# Patient Record
Sex: Male | Born: 1935
Health system: Southern US, Community
[De-identification: ages and names within clinical notes are randomized; demographics above are authoritative.]

## PROBLEM LIST (undated history)

## (undated) DIAGNOSIS — E785 Hyperlipidemia, unspecified: Secondary | ICD-10-CM

## (undated) DIAGNOSIS — Z87442 Personal history of urinary calculi: Secondary | ICD-10-CM

## (undated) DIAGNOSIS — I499 Cardiac arrhythmia, unspecified: Secondary | ICD-10-CM

## (undated) DIAGNOSIS — H269 Unspecified cataract: Secondary | ICD-10-CM

## (undated) DIAGNOSIS — N200 Calculus of kidney: Secondary | ICD-10-CM

## (undated) DIAGNOSIS — M1990 Unspecified osteoarthritis, unspecified site: Secondary | ICD-10-CM

## (undated) DIAGNOSIS — I10 Essential (primary) hypertension: Secondary | ICD-10-CM

## (undated) DIAGNOSIS — I2510 Atherosclerotic heart disease of native coronary artery without angina pectoris: Secondary | ICD-10-CM

## (undated) DIAGNOSIS — R002 Palpitations: Secondary | ICD-10-CM

## (undated) HISTORY — DX: Calculus of kidney: N20.0

## (undated) HISTORY — PX: ANGIOPLASTY: SHX39

## (undated) HISTORY — PX: SPINE SURGERY: SHX786

## (undated) HISTORY — PX: BACK SURGERY: SHX140

## (undated) HISTORY — DX: Unspecified cataract: H26.9

## (undated) HISTORY — DX: Hyperlipidemia, unspecified: E78.5

## (undated) HISTORY — DX: Palpitations: R00.2

## (undated) HISTORY — PX: INGUINAL HERNIA REPAIR: SUR1180

## (undated) HISTORY — PX: LAMINECTOMY: SHX219

---

## 1994-07-31 HISTORY — PX: CORONARY ANGIOPLASTY WITH STENT PLACEMENT: SHX49

## 1998-01-04 ENCOUNTER — Ambulatory Visit (HOSPITAL_COMMUNITY): Admission: RE | Admit: 1998-01-04 | Discharge: 1998-01-04 | Payer: Self-pay | Admitting: *Deleted

## 1998-01-08 ENCOUNTER — Inpatient Hospital Stay (HOSPITAL_COMMUNITY): Admission: RE | Admit: 1998-01-08 | Discharge: 1998-01-09 | Payer: Self-pay | Admitting: *Deleted

## 1999-11-09 ENCOUNTER — Emergency Department (HOSPITAL_COMMUNITY): Admission: EM | Admit: 1999-11-09 | Discharge: 1999-11-09 | Payer: Self-pay | Admitting: Emergency Medicine

## 1999-11-09 ENCOUNTER — Encounter: Payer: Self-pay | Admitting: Emergency Medicine

## 2010-04-08 ENCOUNTER — Encounter: Payer: Self-pay | Admitting: Cardiology

## 2010-04-08 ENCOUNTER — Ambulatory Visit: Payer: Self-pay | Admitting: Cardiology

## 2010-04-08 ENCOUNTER — Ambulatory Visit: Payer: Self-pay

## 2010-04-08 ENCOUNTER — Ambulatory Visit (HOSPITAL_COMMUNITY): Admission: RE | Admit: 2010-04-08 | Discharge: 2010-04-08 | Payer: Self-pay | Admitting: Cardiology

## 2010-04-08 DIAGNOSIS — I2510 Atherosclerotic heart disease of native coronary artery without angina pectoris: Secondary | ICD-10-CM | POA: Insufficient documentation

## 2010-04-08 DIAGNOSIS — R002 Palpitations: Secondary | ICD-10-CM | POA: Insufficient documentation

## 2010-04-08 DIAGNOSIS — I4949 Other premature depolarization: Secondary | ICD-10-CM | POA: Insufficient documentation

## 2010-04-08 DIAGNOSIS — H8109 Meniere's disease, unspecified ear: Secondary | ICD-10-CM | POA: Insufficient documentation

## 2010-04-12 ENCOUNTER — Inpatient Hospital Stay (HOSPITAL_BASED_OUTPATIENT_CLINIC_OR_DEPARTMENT_OTHER): Admission: RE | Admit: 2010-04-12 | Discharge: 2010-04-12 | Payer: Self-pay | Admitting: Cardiovascular Disease

## 2010-04-12 ENCOUNTER — Ambulatory Visit: Payer: Self-pay | Admitting: Internal Medicine

## 2010-04-13 ENCOUNTER — Telehealth: Payer: Self-pay | Admitting: Cardiology

## 2010-04-14 ENCOUNTER — Ambulatory Visit: Payer: Self-pay | Admitting: Cardiovascular Disease

## 2010-04-14 ENCOUNTER — Ambulatory Visit (HOSPITAL_COMMUNITY): Admission: RE | Admit: 2010-04-14 | Discharge: 2010-04-15 | Payer: Self-pay | Admitting: Cardiovascular Disease

## 2010-04-14 ENCOUNTER — Encounter (INDEPENDENT_AMBULATORY_CARE_PROVIDER_SITE_OTHER): Payer: Self-pay | Admitting: *Deleted

## 2010-04-22 DIAGNOSIS — E785 Hyperlipidemia, unspecified: Secondary | ICD-10-CM | POA: Insufficient documentation

## 2010-04-22 DIAGNOSIS — Z87891 Personal history of nicotine dependence: Secondary | ICD-10-CM | POA: Insufficient documentation

## 2010-04-26 ENCOUNTER — Ambulatory Visit: Payer: Self-pay | Admitting: Cardiology

## 2010-05-04 ENCOUNTER — Encounter: Payer: Self-pay | Admitting: Cardiology

## 2010-07-31 HISTORY — PX: CARDIAC CATHETERIZATION: SHX172

## 2010-08-28 LAB — CONVERTED CEMR LAB
BUN: 22 mg/dL (ref 6–23)
Basophils Absolute: 0 10*3/uL (ref 0.0–0.1)
Basophils Relative: 0.7 % (ref 0.0–3.0)
CO2: 26 meq/L (ref 19–32)
Calcium: 8.8 mg/dL (ref 8.4–10.5)
Chloride: 107 meq/L (ref 96–112)
Creatinine, Ser: 1.2 mg/dL (ref 0.4–1.5)
Eosinophils Absolute: 0.1 10*3/uL (ref 0.0–0.7)
Eosinophils Relative: 0.7 % (ref 0.0–5.0)
GFR calc non Af Amer: 61.39 mL/min (ref >60)
Glucose, Bld: 96 mg/dL (ref 70–99)
HCT: 41.5 % (ref 39.0–52.0)
Hemoglobin: 14.6 g/dL (ref 13.0–17.0)
INR: 0.9 (ref 0.8–1.0)
Lymphocytes Relative: 21.8 % (ref 12.0–46.0)
Lymphs Abs: 1.6 10*3/uL (ref 0.7–4.0)
MCHC: 35 g/dL (ref 30.0–36.0)
MCV: 93.7 fL (ref 78.0–100.0)
Monocytes Absolute: 0.5 10*3/uL (ref 0.1–1.0)
Monocytes Relative: 7.4 % (ref 3.0–12.0)
Neutro Abs: 5.1 10*3/uL (ref 1.4–7.7)
Neutrophils Relative %: 69.4 % (ref 43.0–77.0)
Platelets: 182 10*3/uL (ref 150.0–400.0)
Potassium: 4.3 meq/L (ref 3.5–5.1)
Prothrombin Time: 9.9 s (ref 9.7–11.8)
RBC: 4.43 M/uL (ref 4.22–5.81)
RDW: 13.8 % (ref 11.5–14.6)
Sodium: 142 meq/L (ref 135–145)
WBC: 7.4 10*3/uL (ref 4.5–10.5)
aPTT: 25.6 s (ref 21.7–28.8)

## 2010-08-30 NOTE — Letter (Signed)
[facility]: [facility]   Imported By: Marylou Mccoy 05/20/2010 14:45:40  _____________________________________________________________________  External Attachment:    Type:   Image     Comment:   External Document

## 2010-08-30 NOTE — Miscellaneous (Signed)
Summary: update med  Clinical Lists Changes  Medications: Added new medication of PLAVIX 75 MG TABS (CLOPIDOGREL BISULFATE) Take one tablet by mouth daily

## 2010-08-30 NOTE — Letter (Signed)
Summary: Cardiac Catheterization Instructions- [facility]  [facility], Main Office  [address]   [place], [place] [address]   Phone: [phone]  Fax: [phone]     04/08/2010 MRN: [id number]  Ronald Case [address] [place], [place]  [address]  Dear Dr. Greta Doom,   You are scheduled for a Cardiac Catheterization on Tuesday September 13,2011 with Dr. Excell Seltzer  Please arrive to the 1st floor of the [facility] at [facility] at 10:30 am on the day of your procedure. Please do not arrive before 6:30 a.m. Call the [facility] at [phone] if you are unable to make your appointmnet. The Code to get into the parking garage under the building is 0020. Take the elevators to the 1st floor. You must have someone to drive you home. Someone must be with you for the first 24 hours after you arrive home. Please wear clothes that are easy to get on and off and wear slip-on shoes. Do not eat or drink after midnight except water with your medications that morning. Bring all your medications and current insurance cards with you.  __x_ Make sure you take your aspirin.  _x__ You may take ALL of your medications with water that morning.   The usual length of stay after your procedure is 2 to 3 hours. This can vary.  If you have any questions, please call the office at the number listed above.   Dr. Valera Castle Lisabeth Devoid RN

## 2010-08-30 NOTE — Letter (Signed)
Summary: [facility]  [facility]   Imported By: Marylou Mccoy 05/09/2010 07:30:00  _____________________________________________________________________  External Attachment:    Type:   Image     Comment:   External Document

## 2010-08-30 NOTE — Progress Notes (Signed)
Summary: tal to nurse  Phone Note Call from Patient Call back at Home Phone [phone]   Caller: Patient Summary of Call: pt has a question concerning his medications for his angioplasty tomorrow. [phone] Initial call taken by: Edman Circle,  April 13, 2010 11:27 AM  Follow-up for Phone Call        returned pt called and addressed question whether to take ASA in am, advised to take per pre cath letter.  Claris Gladden, RN

## 2010-08-30 NOTE — Assessment & Plan Note (Signed)
Summary: np6/ palps/ pt has uhc. gd   Visit Type:  new pt visit Referring Provider:  Valera Castle Primary Provider:  Marga Melnick  CC:  palpitations....denies any cp or sob...edema/lower leg/ankles at times when on his feet all day.  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 He presented with similar symptoms at that time and was told he had a vasovagal event. He subsequently was referred for stress test which I do not have access to today but he went into V. tach. This was done with Dr. Graciela Husbands. Cardiac catheterization showed a high-grade right coronary lesion that was stented by Dr. Juanda Chance.   He has not had any objective assessment of his coronary disease since.  He denies exertional angina or dyspnea on exertion. He plays golf and enjoys walking.  Irregularity is happened a couple times over the past week or so. At one point he noticed a radial pulse it was about 40 beats a minute. He did not have any presyncope or syncope. No other associated symptoms. His heart rate was slow enough that he held his beta blocker which she's been on for a long time the next day.  Preventive Screening-Counseling & Management  Alcohol-Tobacco     Smoking Status: quit  Caffeine-Diet-Exercise     Does Patient Exercise: yes      Drug Use:  no.    Current Medications (verified): 1)  Metoprolol Succinate 25 Mg Xr24h-Tab (Metoprolol Succinate) .... 1/2 Tab Once Daily 2)  Lipitor 10 Mg Tabs (Atorvastatin Calcium) .Marland Kitchen.. 1 Tab At Bedtime 3)  Aspirin Ec 325 Mg Tbec (Aspirin) .... Take One Tablet By Mouth Daily  Allergies (verified): No Known Drug Allergies  Past History:  Family History: Last updated: 05-02-2010 Mother: deceased @ 37 Subdural hematoma Father: deceased @ 26 CVA ? Siblings: Brother 53  Social History: Last updated:  2010/05/02 Full Time Physcian GYN Married  Tobacco Use - Former.  quit 1967 Regular Exercise - yes.Kerin Salen, walking 3-4 weekly Drug Use - no  Risk Factors: Exercise: yes (02-May-2010)  Risk Factors: Smoking Status: quit (05-02-2010)  Past Medical History: Kindney stones MENIERE'S DISEASE (ICD-386.00) PALPITATIONS (ICD-785.1)  Past Surgical History: Inguinal hernia repair Lumbar laminectomy x2  Angioplasty 3 stents w/Dr Charlies Constable  Family History: Mother: deceased @ 76 Subdural hematoma Father: deceased @ 22 CVA ? Siblings: Brother 87  Social History: Full Time Physcian GYN Married  Tobacco Use - Former.  quit 1967 Regular Exercise - yes.Kerin Salen, walking 3-4 weekly Drug Use - no Smoking Status:  quit Does Patient Exercise:  yes Drug Use:  no  Review of Systems       negative other than history of present illness  Vital Signs:  Patient profile:   77 year old male Height:      72 inches Weight:      193.8 pounds BMI:     26.38 Pulse rate:   73 / minute Pulse rhythm:   irregular BP sitting:   120 / 80  (left arm) Cuff size:   large  Vitals Entered By: Danielle Rankin, CMA 05/02/2010 1:45 PM)  Physical Exam  General:  no acute distress, well-developed well-nourished. Head:  normocephalic and atraumatic Eyes:  glasses otherwise normal Neck:  Neck supple, no JVD. No masses, thyromegaly or abnormal cervical nodes. Chest Janelys Glassner:  no deformities or breast masses noted  Lungs:  Clear bilaterally to auscultation and percussion. Heart:  PMI not displaced, normal S1-S2, no murmur or gallop. Carotids equal bilaterally without bruits Abdomen:  Bowel sounds positive; abdomen soft and non-tender without masses, organomegaly, or hernias noted. No hepatosplenomegaly. Msk:  Back normal, normal gait. Muscle strength and tone normal. Pulses:  pulses normal in all 4 extremities Extremities:  No clubbing or cyanosis. Neurologic:  Alert and oriented x 3. Skin:  Intact  without lesions or rashes. Psych:  Normal affect.   Problems:  Medical Problems Added: 1)  Dx of Premature Ventricular Contractions  (ICD-427.69) 2)  Dx of Coronary Atherosclerosis Native Coronary Artery  (ICD-414.01) 3)  Dx of Meniere's Disease  (ICD-386.00) 4)  Dx of Palpitations  (ICD-785.1)  EKG  Procedure date:  04/08/2010  Findings:      normal sin one PVC, otherwise normal EKG.[place] rhythm with a first-degree AV block and  Impression & Recommendations:  Problem # 1:  PALPITATIONS (ICD-785.1) I suspect these are PVCs. The question is this related to any underlying ischemia. Coupled with her bradycardia, which he had in the past, I am concerned about recurrent coronary ischemia. I have recommended cardiac catheterization next Tuesday with Dr. Excell Seltzer. Indication this patient benefits were discussed. I have told him to take it easy over the weekend starting to walk and plate. I made no change in his medical program. I have given him my cell phone number in case he has any problems over the interim. I've also discussed it with Dr. Alwyn Ren his primary care physician. His wife is aware of his visit today but is not here.  I've asked him to proceed with an outpatient cardiac catheterization next week. Dr. Juanda Chance  His updated medication list for this problem includes:    Metoprolol Succinate 25 Mg Xr24h-tab (Metoprolol succinate) .Marland Kitchen... 1/2 tab once daily    Aspirin Ec 325 Mg Tbec (Aspirin) .Marland Kitchen... Take one tablet by mouth daily  Problem # 2:  PREMATURE VENTRICULAR CONTRACTIONS (ICD-427.69) Assessment: New  His updated medication list for this problem includes:    Metoprolol Succinate 25 Mg Xr24h-tab (Metoprolol succinate) .Marland Kitchen... 1/2 tab once daily    Aspirin Ec 325 Mg Tbec (Aspirin) .Marland Kitchen... Take one tablet by mouth daily  Orders: EKG w/ Interpretation (93000) TLB-BMP (Basic Metabolic Panel-BMET) (80048-METABOL) TLB-CBC Platelet - w/Differential (85025-CBCD) TLB-PT (Protime)  (85610-PTP) TLB-PTT (85730-PTTL) T-2 View CXR (71020TC)  His updated medication list for this problem includes:    Metoprolol Succinate 25 Mg Xr24h-tab (Metoprolol succinate) .Marland Kitchen... 1/2 tab once daily    Aspirin Ec 325 Mg Tbec (Aspirin) .Marland Kitchen... Take one tablet by mouth daily  Problem # 3:  CORONARY ATHEROSCLEROSIS NATIVE CORONARY ARTERY (ICD-414.01)  His updated medication list for this problem includes:    Metoprolol Succinate 25 Mg Xr24h-tab (Metoprolol succinate) .Marland Kitchen... 1/2 tab once daily    Aspirin Ec 325 Mg Tbec (Aspirin) .Marland Kitchen... Take one tablet by mouth daily  Orders: EKG w/ Interpretation (93000) TLB-BMP (Basic Metabolic Panel-BMET) (80048-METABOL) TLB-CBC Platelet - w/Differential (85025-CBCD) TLB-PT (Protime) (85610-PTP) TLB-PTT (85730-PTTL) T-2 View CXR (71020TC)  His updated medication list for this problem includes:    Metoprolol Succinate 25 Mg Xr24h-tab (Metoprolol succinate) .Marland Kitchen... 1/2 tab once daily    Aspirin Ec 325 Mg Tbec (Aspirin) .Marland Kitchen... Take one tablet by mouth daily  Patient Instructions: 1)  Your physician has requested that you have a cardiac catheterization.  Cardiac catheterization is used to diagnose and/or treat various heart conditions. Doctors may recommend this procedure for a number  of different reasons. The most common reason is to evaluate chest pain. Chest pain can be a symptom of coronary artery disease (CAD), and cardiac catheterization can show whether plaque is narrowing or blocking your heart's arteries. This procedure is also used to evaluate the valves, as well as measure the blood flow and oxygen levels in different parts of your heart.  For further information please visit [email or web address]  Please follow instruction sheet, as given. 2)  Your physician recommends that you continue on your current medications as directed. Please refer to the Current Medication list given to you today.

## 2010-08-30 NOTE — Cardiovascular Report (Signed)
Summary: Pre Cath Orders   Pre Cath Orders   Imported By: Roderic Ovens 04/18/2010 13:37:45  _____________________________________________________________________  External Attachment:    Type:   Image     Comment:   External Document

## 2010-08-30 NOTE — Assessment & Plan Note (Signed)
Summary: eph. appt is 1:30/ gd   Visit Type:  EPH Referring Provider:  Valera Castle Primary Provider:  Marga Melnick  CC:  PVC's still.....denies any other complaint today.  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 He had a 90% in-stent restenosis of the right coronary artery. He had PTCA and a drug-eluting stent placed by Dr. Excell Seltzer. He had nonobstructive disease otherwise. He has normal left ventricular systolic function by catheter and 2-D echocardiogram.  Since discharge his PVCs have been a little bit better.    Clinical Reports Reviewed:  CXR:  04/08/2010: CXR Results:   Clinical Data: Coronary artery disease.    CHEST - 2 VIEW    Comparison: None.    Findings: The cardiac silhouette is normal size and shape. The   lungs are well aerated and free of infiltrates. No pleural   abnormality is evident. Large osteophytes are seen in the spine   consistent with diffuse idiopathic skeletal hyperostosis.  There is   mild degenerative spondylosis.    IMPRESSION:   No acute or active cardiopulmonary process is identified.    Read By:  Crawford Givens,  M.D.   Released By:  Crawford Givens,  M.D.  Cardiac Cath:  04/14/2010: Cardiac Cath Findings:   FINAL CONCLUSIONS:  Successful percutaneous intervention of the right   coronary artery using a single drug-eluting stent reducing to 90% in-   stent restenosis to 0 with TIMI 3 flow both pre and post.      RECOMMENDATIONS:  The patient should remain on dual antiplatelet therapy   with aspirin and Plavix for a minimum of 12 months.               Veverly Fells. Excell Seltzer, MD         04/12/2010: Cardiac Cath Findings:   Left ventriculography:  LV function is normal.  The LVEF is estimated at   60%.      ASSESSMENT:   1. Severe in-stent restenosis of the mid-right coronary artery.   2. Minor nonobstructive stenosis of the  left main, left anterior       descending (coronary artery), and left circumflex.   3. Normal left ventricular function.      PLAN:  Recommend to start the patient on clopidogrel.  We will plan on   PCI of the right coronary artery using a drug-eluting stent platform.   Procedure will be performed later this week in the inpatient cath lab.               Veverly Fells. Excell Seltzer, MD       Current Medications (verified): 1)  Metoprolol Succinate 25 Mg Xr24h-Tab (Metoprolol Succinate) .Marland Kitchen.. 1 Tab Once Daily 2)  Lipitor 10 Mg Tabs (Atorvastatin Calcium) .Marland Kitchen.. 1 Tab At Bedtime 3)  Aspirin Ec 325 Mg Tbec (Aspirin) .... Take One Tablet By Mouth Daily 4)  Plavix 75 Mg Tabs (Clopidogrel Bisulfate) .... Take One Tablet By Mouth Daily 5)  Nitrostat 0.4 Mg Subl (Nitroglycerin) .Marland Kitchen.. 1 Tablet Under Tongue At Onset of Chest Pain; You May Repeat Every 5 Minutes For Up To 3 Doses.  Allergies (verified): No Known Drug Allergies  Past History:  Past Medical History: Last updated: 04/08/2010 Kindney stones MENIERE'S DISEASE (ICD-386.00) PALPITATIONS (ICD-785.1)  Past Surgical History: Last updated: 04/08/2010 Inguinal hernia repair Lumbar laminectomy x2  Angioplasty 3 stents w/Dr Charlies Constable  Family History: Last updated: 04/08/2010 Mother:  deceased @ 45 Subdural hematoma Father: deceased @ 42 CVA ? 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  quit 1967 Regular Exercise - yes.Kerin Salen, walking 3-4 weekly Drug Use - no  Risk Factors: Exercise: yes (04/08/2010)  Risk Factors: Smoking Status: quit (04/08/2010)  Review of Systems       negative other than history of present illness  Vital Signs:  Patient profile:   77 year old male Height:      72 inches Weight:      195.8 pounds BMI:     26.65 Pulse rate:   81 / minute Pulse rhythm:   irregular BP sitting:   100 / 60  (left arm) Cuff size:   large  Vitals  Entered By: Danielle Rankin, CMA (April 26, 2010 1:57 PM)  Physical Exam  General:  Well developed, well nourished, in no acute distress. Head:  normocephalic and atraumatic Eyes:  PERRLA/EOM intact; conjunctiva and lids normal. Neck:  Neck supple, no JVD. No masses, thyromegaly or abnormal cervical nodes. Chest Neftali Abair:  no deformities or breast masses noted Lungs:  Clear bilaterally to auscultation and percussion. Heart:  PMI nondisplaced, normal S1-S2, no murmur or gallop. Carotids equal bilaterally without bruits Msk:  Back normal, normal gait. Muscle strength and tone normal. Pulses:  pulses normal in all 4 extremities Extremities:  No clubbing or cyanosis. Neurologic:  Alert and oriented x 3. Skin:  Intact without lesions or rashes. Psych:  Normal affect.   Impression & Recommendations:  Problem # 1:  CORONARY ATHEROSCLEROSIS NATIVE CORONARY ARTERY (ICD-414.01) Assessment Improved  His updated medication list for this problem includes:    Metoprolol Succinate 25 Mg Xr24h-tab (Metoprolol succinate) .Marland Kitchen... 1 tab once daily    Aspirin Ec 325 Mg Tbec (Aspirin) .Marland Kitchen... Take one tablet by mouth daily    Plavix 75 Mg Tabs (Clopidogrel bisulfate) .Marland Kitchen... Take one tablet by mouth daily    Nitrostat 0.4 Mg Subl (Nitroglycerin) .Marland Kitchen... 1 tablet under tongue at onset of chest pain; you may repeat every 5 minutes for up to 3 doses.  Orders: EKG w/ Interpretation (93000)  Problem # 2:  HYPERLIPIDEMIA (ICD-272.4)  His updated medication list for this problem includes:    Lipitor 10 Mg Tabs (Atorvastatin calcium) .Marland Kitchen... 1 tab at bedtime  Problem # 3:  PALPITATIONS (ICD-785.1) Assessment: Improved  His updated medication list for this problem includes:    Metoprolol Succinate 25 Mg Xr24h-tab (Metoprolol succinate) .Marland Kitchen... 1 tab once daily    Aspirin Ec 325 Mg Tbec (Aspirin) .Marland Kitchen... Take one tablet by mouth daily    Plavix 75 Mg Tabs (Clopidogrel bisulfate) .Marland Kitchen... Take one tablet by mouth daily     Nitrostat 0.4 Mg Subl (Nitroglycerin) .Marland Kitchen... 1 tablet under tongue at onset of chest pain; you may repeat every 5 minutes for up to 3 doses.  Problem # 4:  PREMATURE VENTRICULAR CONTRACTIONS (ICD-427.69) Assessment: Improved  His updated medication list for this problem includes:    Metoprolol Succinate 25 Mg Xr24h-tab (Metoprolol succinate) .Marland Kitchen... 1 tab once daily    Aspirin Ec 325 Mg Tbec (Aspirin) .Marland Kitchen... Take one tablet by mouth daily    Plavix 75 Mg Tabs (Clopidogrel bisulfate) .Marland Kitchen... Take one tablet by mouth daily    Nitrostat 0.4 Mg Subl (Nitroglycerin) .Marland Kitchen... 1 tablet under tongue at onset of chest pain; you may repeat every 5 minutes for up to 3 doses.  Patient Instructions: 1)  Your physician  recommends that you schedule a follow-up appointment in: 6 months

## 2010-10-13 LAB — BASIC METABOLIC PANEL
BUN: 12 mg/dL (ref 6–23)
BUN: 18 mg/dL (ref 6–23)
CO2: 21 mEq/L (ref 19–32)
CO2: 28 mEq/L (ref 19–32)
Calcium: 8.4 mg/dL (ref 8.4–10.5)
Calcium: 8.5 mg/dL (ref 8.4–10.5)
Chloride: 107 mEq/L (ref 96–112)
Chloride: 108 mEq/L (ref 96–112)
Creatinine, Ser: 1.18 mg/dL (ref 0.4–1.5)
Creatinine, Ser: 1.28 mg/dL (ref 0.4–1.5)
GFR calc Af Amer: >60 mL/min (ref >60)
GFR calc Af Amer: >60 mL/min (ref >60)
GFR calc non Af Amer: 55 mL/min — ABNORMAL LOW (ref >60)
GFR calc non Af Amer: >60 mL/min (ref >60)
Glucose, Bld: 111 mg/dL — ABNORMAL HIGH (ref 70–99)
Glucose, Bld: 113 mg/dL — ABNORMAL HIGH (ref 70–99)
Potassium: 3.8 mEq/L (ref 3.5–5.1)
Potassium: 4.1 mEq/L (ref 3.5–5.1)
Sodium: 136 mEq/L (ref 135–145)
Sodium: 142 mEq/L (ref 135–145)

## 2010-10-13 LAB — CBC
HCT: 40 % (ref 39.0–52.0)
Hemoglobin: 14 g/dL (ref 13.0–17.0)
MCH: 31.9 pg (ref 26.0–34.0)
MCHC: 35 g/dL (ref 30.0–36.0)
MCV: 91.1 fL (ref 78.0–100.0)
Platelets: 140 10*3/uL — ABNORMAL LOW (ref 150–400)
RBC: 4.39 MIL/uL (ref 4.22–5.81)
RDW: 13.5 % (ref 11.5–15.5)
WBC: 6.7 10*3/uL (ref 4.0–10.5)

## 2010-11-17 ENCOUNTER — Encounter: Payer: Self-pay | Admitting: Cardiology

## 2010-11-18 ENCOUNTER — Ambulatory Visit (INDEPENDENT_AMBULATORY_CARE_PROVIDER_SITE_OTHER): Payer: Medicare Other | Admitting: Cardiology

## 2010-11-18 ENCOUNTER — Encounter: Payer: Self-pay | Admitting: Cardiology

## 2010-11-18 VITALS — BP 134/82 | HR 71 | Ht 72.0 in | Wt 194.0 lb

## 2010-11-18 DIAGNOSIS — I4949 Other premature depolarization: Secondary | ICD-10-CM

## 2010-11-18 DIAGNOSIS — I2510 Atherosclerotic heart disease of native coronary artery without angina pectoris: Secondary | ICD-10-CM

## 2010-11-18 DIAGNOSIS — E785 Hyperlipidemia, unspecified: Secondary | ICD-10-CM

## 2010-11-18 NOTE — Assessment & Plan Note (Signed)
He will check his blood work at his office and send to [place].

## 2010-11-18 NOTE — Assessment & Plan Note (Signed)
Stable, no change in meds.   

## 2010-11-18 NOTE — Progress Notes (Signed)
   Patient ID: Ronald Case, male    DOB: Nov 15, 1931, 77 y.o.   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 He is having no ischemic sxs including PVC's and palpitations. He remains on Plavix. He is due Lipids and LFT's.  EKG shows NSR with first degree AVB.  Review of Systems    Physical Exam  Nursing note and vitals reviewed. Constitutional: He is oriented to person, place, and time. He appears well-developed and well-nourished. No distress.  HENT:  Head: Atraumatic.  Eyes: EOM are normal. Pupils are equal, round, and reactive to light.  Neck: Normal range of motion. No JVD present. No tracheal deviation present. No thyromegaly present.  Cardiovascular: Normal rate, regular rhythm, normal heart sounds and intact distal pulses.        No bruits  Pulmonary/Chest: Effort normal and breath sounds normal.  Musculoskeletal: He exhibits no edema.  Neurological: He is alert and oriented to person, place, and time.  Skin: Skin is warm.  Psychiatric: He has a normal mood and affect.

## 2010-11-18 NOTE — Patient Instructions (Signed)
Your physician recommends that you have lab work in: fasting lipid and lfts drawn at your office and faxed to our office [phone]. Your physician recommends that you schedule a follow-up appointment in: 6 months with Dr. Daleen Squibb

## 2010-11-22 ENCOUNTER — Encounter: Payer: Self-pay | Admitting: Cardiology

## 2010-12-12 ENCOUNTER — Telehealth: Payer: Self-pay | Admitting: *Deleted

## 2010-12-12 MED ORDER — ATORVASTATIN CALCIUM 40 MG PO TABS: 40.0000 mg | ORAL_TABLET | Freq: Every day | ORAL | Status: DC

## 2010-12-12 NOTE — Telephone Encounter (Signed)
Pt aware of cholesterol results.  Will increase lipitor to 40mg  qd and call back to schedule lab results. Mylo Red RN

## 2011-06-10 IMAGING — CR DG CHEST 2V
2 series · 2 of 2 positions shown · non-contrast
Comparison: None.

CLINICAL DATA: Coronary artery disease.

CHEST - 2 VIEW

[view not recorded (1 of 2)]
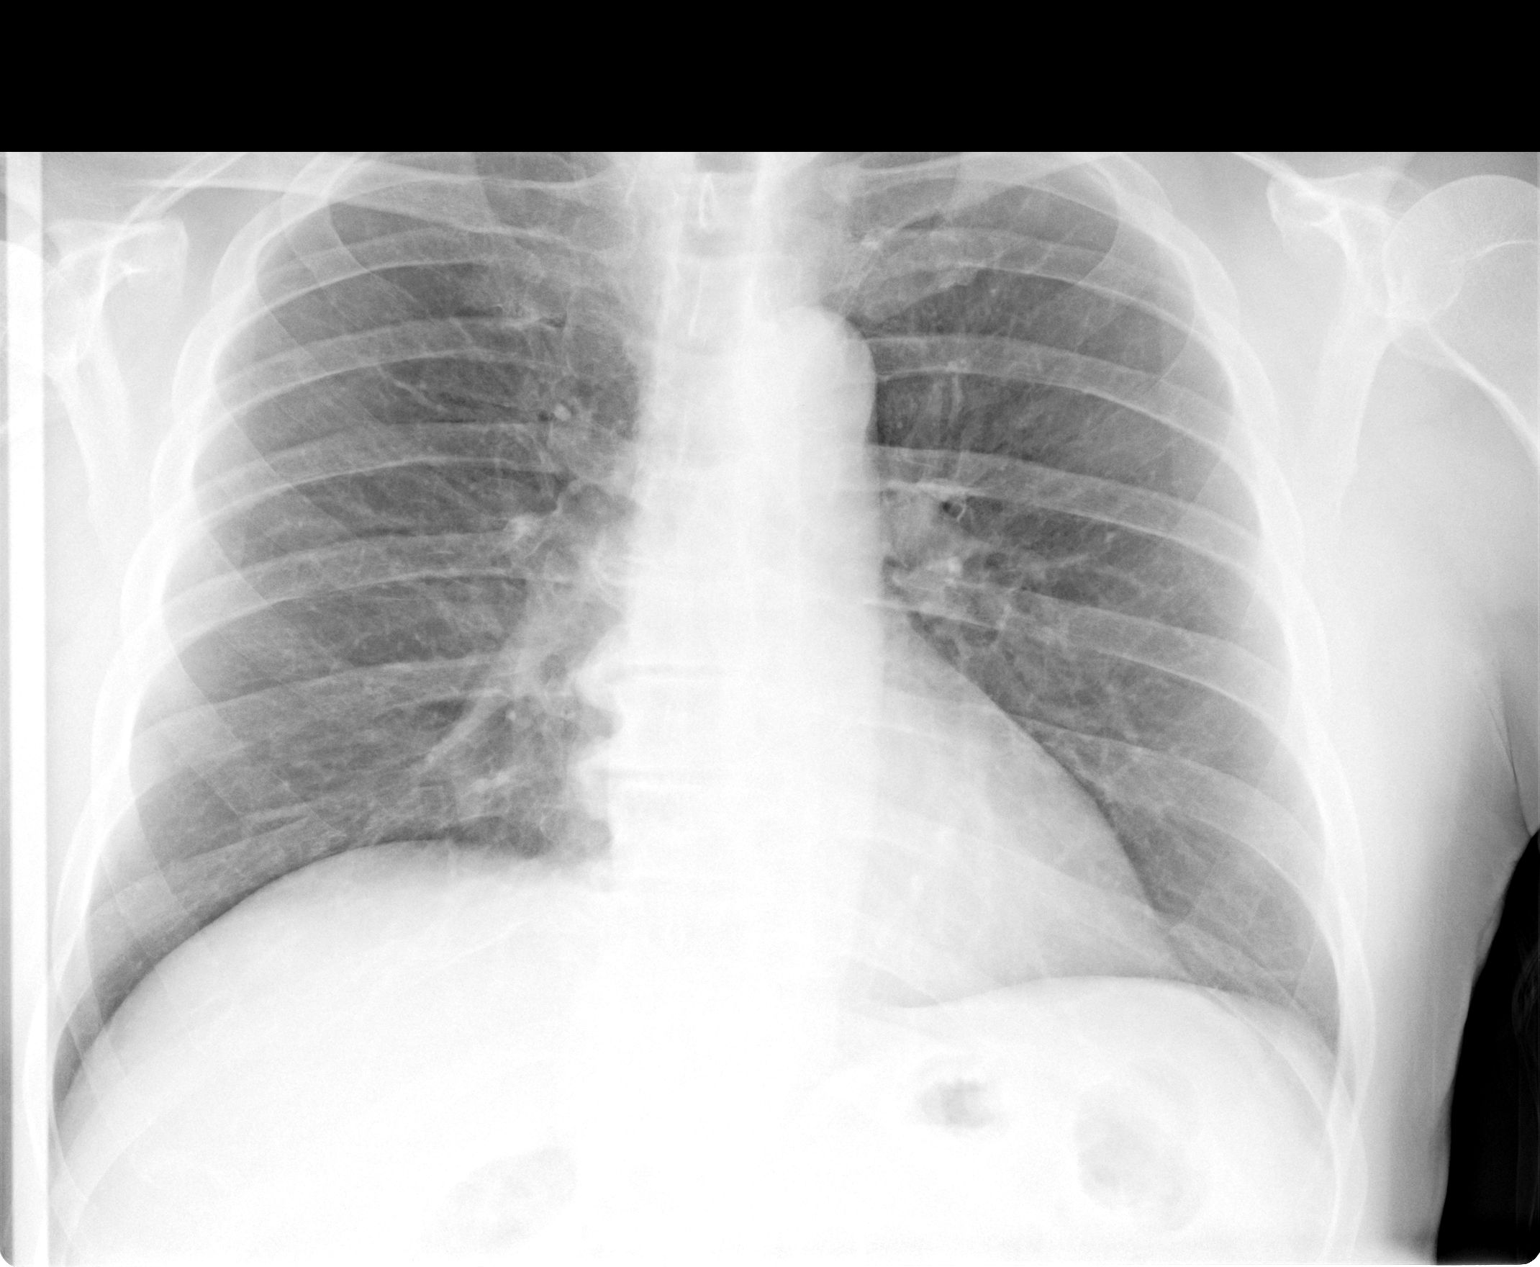

[view not recorded (2 of 2)]
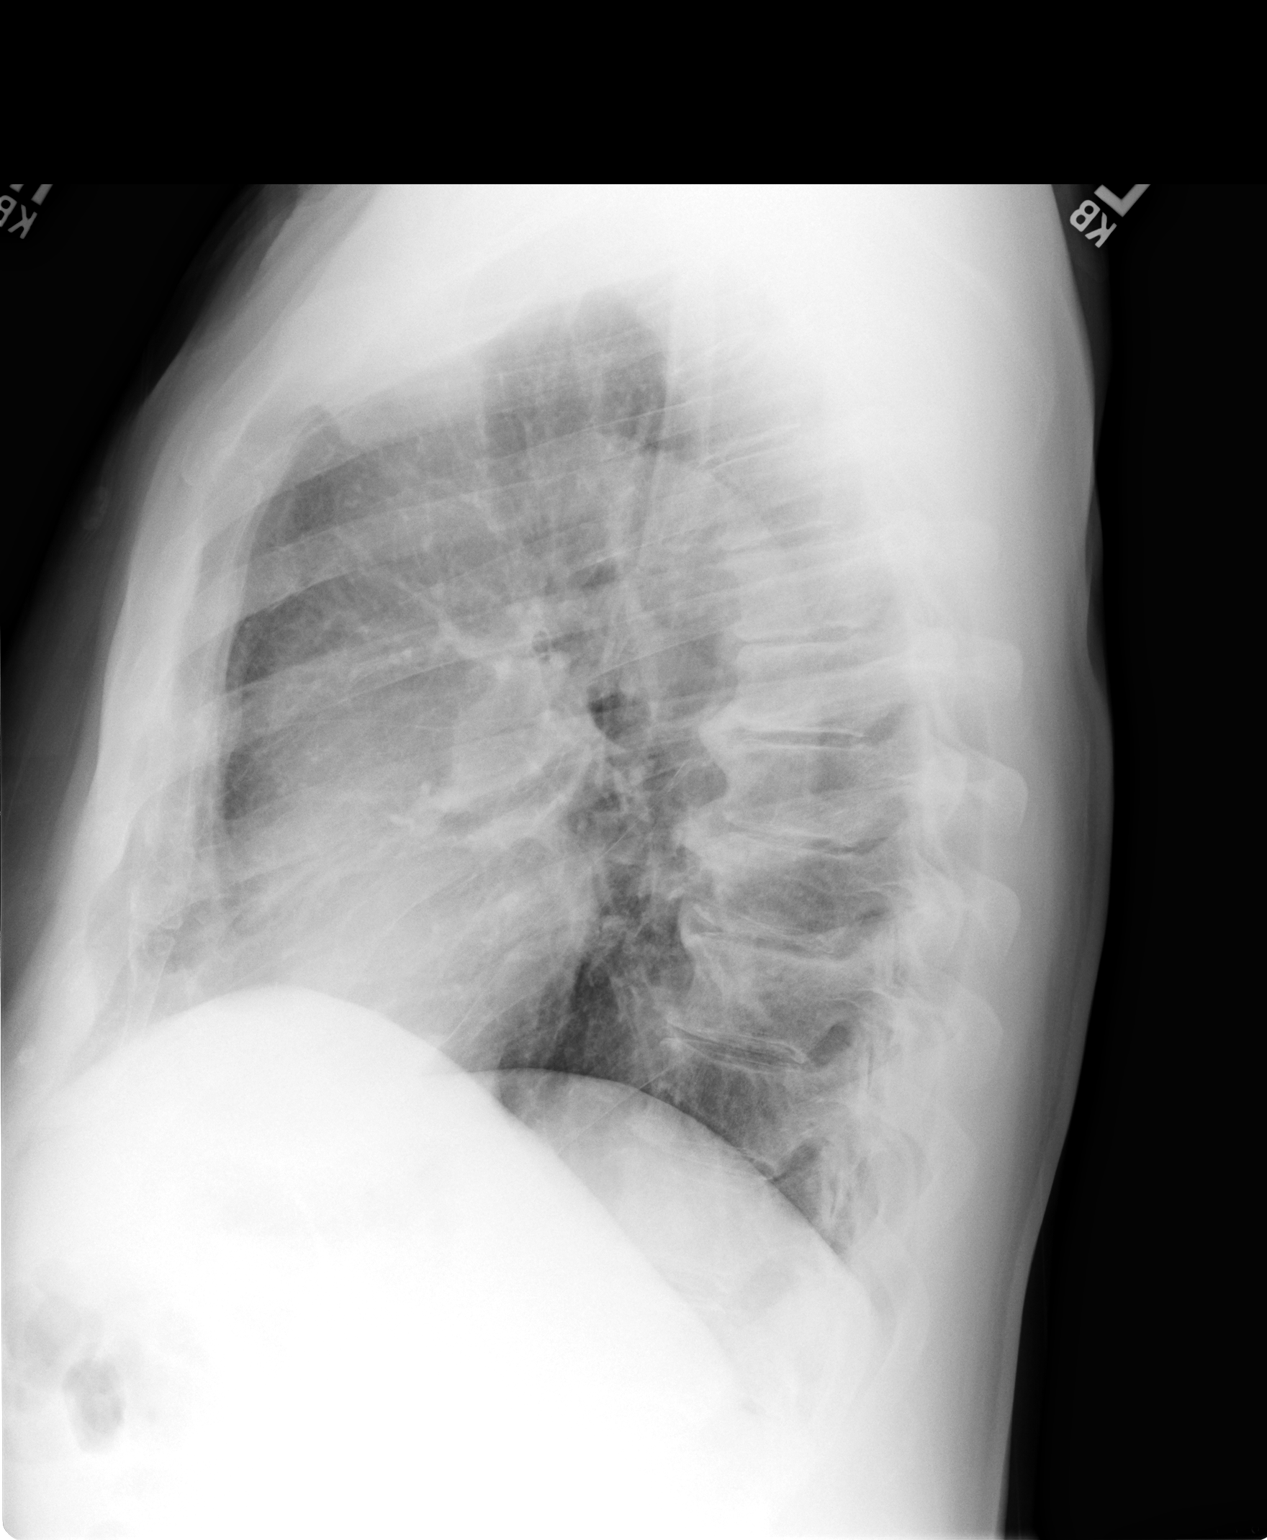

[2 of 2 positions shown; findings below may reference images not displayed]

FINDINGS: The cardiac silhouette is normal size and shape. The
lungs are well aerated and free of infiltrates. No pleural
abnormality is evident. Large osteophytes are seen in the spine
consistent with diffuse idiopathic skeletal hyperostosis.  There is
mild degenerative spondylosis.
IMPRESSION: No acute or active cardiopulmonary process is identified.

## 2016-05-02 DIAGNOSIS — R69 Illness, unspecified: Secondary | ICD-10-CM | POA: Diagnosis not present

## 2016-06-08 DIAGNOSIS — R69 Illness, unspecified: Secondary | ICD-10-CM | POA: Diagnosis not present

## 2017-05-10 DIAGNOSIS — R69 Illness, unspecified: Secondary | ICD-10-CM | POA: Diagnosis not present

## 2017-06-19 DIAGNOSIS — R69 Illness, unspecified: Secondary | ICD-10-CM | POA: Diagnosis not present

## 2017-10-30 ENCOUNTER — Encounter: Payer: Self-pay | Admitting: Family Medicine

## 2017-10-30 ENCOUNTER — Ambulatory Visit (INDEPENDENT_AMBULATORY_CARE_PROVIDER_SITE_OTHER): Payer: Medicare HMO | Admitting: Family Medicine

## 2017-10-30 VITALS — BP 130/84 | HR 66 | Temp 97.9°F | Wt 195.8 lb

## 2017-10-30 DIAGNOSIS — E785 Hyperlipidemia, unspecified: Secondary | ICD-10-CM | POA: Diagnosis not present

## 2017-10-30 DIAGNOSIS — I2510 Atherosclerotic heart disease of native coronary artery without angina pectoris: Secondary | ICD-10-CM | POA: Diagnosis not present

## 2017-10-30 DIAGNOSIS — I10 Essential (primary) hypertension: Secondary | ICD-10-CM

## 2017-10-30 NOTE — Patient Instructions (Signed)
Monitor blood pressure and be in touch if consistently > 140/90.   

## 2017-10-30 NOTE — Progress Notes (Signed)
Subjective:     Patient ID: Ronald Case, male   DOB: 31-Mar-1932, 84 y.o.   MRN: [id number]  HPI Patient seen to establish care. He has history of CAD, hyperlipidemia, recently diagnosed hypertension. Patient states he had angioplasty at age 87 followed by repeat procedure about 8 years ago. No recent chest pains.  His previous GYN partner placed him last week on losartan 50 mg daily. He had gone to [employer] screening (last week) and states he had normal carotid and abdominal aortic aneurysm screens. His blood pressure there was up around 324 systolic and 401 diastolic. No recent headaches. No chest pains. Placed on losartan 50 mg once daily and tolerating without side effects. Blood pressures at home since last week have been somewhat labile. Had occasional low around 122/80 but has also had some readings up around 160/90.  Quit smoking back in 1967. He retired from doing GYN work 3 years ago. Enjoys golf and gardening  Past Medical History:  Diagnosis Date  . Kidney stones   . Meniere's disease   . Palpitations      reports that he quit smoking about 52 years ago. His smoking use included cigarettes. He has never used smokeless tobacco. He reports that he does not drink alcohol or use drugs. family history includes Other (age of onset: 63) in his mother; Stroke (age of onset: 89) in his father. No Known Allergies   Review of Systems  Constitutional: Negative for appetite change and unexpected weight change.  Respiratory: Negative for shortness of breath.   Cardiovascular: Negative for chest pain, palpitations and leg swelling.  Gastrointestinal: Negative for abdominal pain.  Genitourinary: Negative for dysuria.  Neurological: Negative for dizziness and weakness.       Objective:   Physical Exam  Constitutional: He appears well-developed and well-nourished. No distress.  Neck: Neck supple. No thyromegaly present.  Cardiovascular: Normal rate and regular rhythm.   Pulmonary/Chest: Effort normal and breath sounds normal. No respiratory distress. He has no wheezes. He has no rales.  Musculoskeletal: He exhibits no edema.       Assessment:     #1 history of CAD. He has not seen cardiology in several years. Prior angioplasty 2  #2 hypertension just recently initiated few days ago on losartan 50 mg daily. Blood pressure initially elevated here 150/98 and decreased to 130/84 after several minutes of rest  #3 history of dyslipidemia. No recent labs    Plan:     -Check labs with lipid panel, hepatic panel, basic metabolic panel, CBC -Continue close monitoring of blood pressure at home and be in touch if consistently greater than 140/90 -Schedule complete physical within the next month.  If blood pressure not consistently around 130/80 that point consider further titration of losartan versus additional medication such as low-dose amlodipine  Ronald Post MD [facility]

## 2017-10-31 LAB — CBC WITH DIFFERENTIAL/PLATELET
BASOS PCT: 0.4 % (ref 0.0–3.0)
Basophils Absolute: 0 10*3/uL (ref 0.0–0.1)
EOS ABS: 0.1 10*3/uL (ref 0.0–0.7)
Eosinophils Relative: 1.8 % (ref 0.0–5.0)
HEMATOCRIT: 44.7 % (ref 39.0–52.0)
Hemoglobin: 15.4 g/dL (ref 13.0–17.0)
LYMPHS PCT: 30.5 % (ref 12.0–46.0)
Lymphs Abs: 2.1 10*3/uL (ref 0.7–4.0)
MCHC: 34.4 g/dL (ref 30.0–36.0)
MCV: 92.6 fl (ref 78.0–100.0)
MONO ABS: 0.8 10*3/uL (ref 0.1–1.0)
Monocytes Relative: 12.2 % — ABNORMAL HIGH (ref 3.0–12.0)
Neutro Abs: 3.7 10*3/uL (ref 1.4–7.7)
Neutrophils Relative %: 55.1 % (ref 43.0–77.0)
Platelets: 194 10*3/uL (ref 150.0–400.0)
RBC: 4.82 Mil/uL (ref 4.22–5.81)
RDW: 13.9 % (ref 11.5–15.5)
WBC: 6.8 10*3/uL (ref 4.0–10.5)

## 2017-10-31 LAB — BASIC METABOLIC PANEL
BUN: 22 mg/dL (ref 6–23)
CO2: 28 meq/L (ref 19–32)
Calcium: 8.8 mg/dL (ref 8.4–10.5)
Chloride: 104 mEq/L (ref 96–112)
Creatinine, Ser: 1.32 mg/dL (ref 0.40–1.50)
GFR: 54.99 mL/min — AB (ref >60.00)
GLUCOSE: 68 mg/dL — AB (ref 70–99)
POTASSIUM: 4.2 meq/L (ref 3.5–5.1)
SODIUM: 140 meq/L (ref 135–145)

## 2017-10-31 LAB — LIPID PANEL
Cholesterol: 184 mg/dL (ref 0–200)
HDL: 34.1 mg/dL — AB (ref >39.00)
NONHDL: 149.79
TRIGLYCERIDES: 308 mg/dL — AB (ref 0.0–149.0)
Total CHOL/HDL Ratio: 5
VLDL: 61.6 mg/dL — AB (ref 0.0–40.0)

## 2017-10-31 LAB — LDL CHOLESTEROL, DIRECT: LDL DIRECT: 56 mg/dL

## 2017-10-31 LAB — HEPATIC FUNCTION PANEL
ALT: 24 U/L (ref 0–53)
AST: 19 U/L (ref 0–37)
Albumin: 4 g/dL (ref 3.5–5.2)
Alkaline Phosphatase: 100 U/L (ref 39–117)
BILIRUBIN TOTAL: 0.8 mg/dL (ref 0.2–1.2)
Bilirubin, Direct: 0.1 mg/dL (ref 0.0–0.3)
Total Protein: 6.3 g/dL (ref 6.0–8.3)

## 2017-11-28 ENCOUNTER — Encounter: Payer: Self-pay | Admitting: Family Medicine

## 2017-11-28 ENCOUNTER — Ambulatory Visit (INDEPENDENT_AMBULATORY_CARE_PROVIDER_SITE_OTHER): Payer: Medicare HMO | Admitting: Family Medicine

## 2017-11-28 VITALS — BP 154/80 | HR 58 | Temp 97.6°F | Ht 70.5 in | Wt 197.3 lb

## 2017-11-28 DIAGNOSIS — I10 Essential (primary) hypertension: Secondary | ICD-10-CM | POA: Diagnosis not present

## 2017-11-28 DIAGNOSIS — R229 Localized swelling, mass and lump, unspecified: Secondary | ICD-10-CM | POA: Diagnosis not present

## 2017-11-28 DIAGNOSIS — Z0001 Encounter for general adult medical examination with abnormal findings: Secondary | ICD-10-CM

## 2017-11-28 DIAGNOSIS — Z0000 Encounter for general adult medical examination without abnormal findings: Secondary | ICD-10-CM

## 2017-11-28 MED ORDER — LOSARTAN POTASSIUM 100 MG PO TABS: 100.0000 mg | ORAL_TABLET | Freq: Every day | ORAL | 3 refills | Status: DC

## 2017-11-28 NOTE — Patient Instructions (Signed)
We will set up dermatology appointment  Increase the Losartan to 100 mg daily.  Consider new shingles vaccine- Shingrix and can get this at pharmacy.

## 2017-11-28 NOTE — Progress Notes (Signed)
  Subjective:     Patient ID: Ronald Flank, MD, male   DOB: Jul 01, 1932, 84 y.o.   MRN: [id number]  HPI Patient seen for physical. He has history of CAD, hyperlipidemia, past history of nicotine use. Is retired Software engineer.  He states his had both Prevnar and Pneumovax. No history of shingles vaccine. Has aged out of a colonoscopy. Not a candidate for low-dose CT lung cancer screening because of age. Recent issues with elevated blood pressure. Just recently started losartan 50 mg daily. Blood pressures at home vary between 809X to 833A systolic.Marland Kitchen  Had recent labs significant for elevated triglycerides and low HDL.  Patient had recent [facility] screening with no evidence for carotid disease and no evidence for abdominal aneurysm. He had EKG which showed no atrial fibrillation  plays golf a few days week. Occasional walks for exercise but not consistently  Past Medical History:  Diagnosis Date  . Kidney stones   . Meniere's disease   . Palpitations      reports that he quit smoking about 52 years ago. His smoking use included cigarettes. He has never used smokeless tobacco. He reports that he does not drink alcohol or use drugs. family history includes Other (age of onset: 64) in his mother; Stroke (age of onset: 78) in his father. No Known Allergies    Review of Systems  Constitutional: Negative for fatigue.  Eyes: Negative for visual disturbance.  Respiratory: Negative for cough, chest tightness and shortness of breath.   Cardiovascular: Negative for chest pain, palpitations and leg swelling.  Endocrine: Negative for polydipsia and polyuria.  Musculoskeletal: Positive for arthralgias (Mostly left knee).  Neurological: Negative for dizziness, syncope, weakness, light-headedness and headaches.       Objective:   Physical Exam  Constitutional: He appears well-developed and well-nourished.  HENT:  Mouth/Throat: Oropharynx is clear and moist.  Minimal cerumen both canals  Neck:  Neck supple. No thyromegaly present.  Cardiovascular: Normal rate, regular rhythm and normal heart sounds.  Pulmonary/Chest: Effort normal and breath sounds normal. He has no wheezes. He has no rales.  Abdominal: Soft. He exhibits no distension and no mass. There is no tenderness. There is no rebound and no guarding.  Musculoskeletal: He exhibits no edema.  Lymphadenopathy:    He has no cervical adenopathy.  Skin:  Patient has nodular lesion left anterior shoulder which is about 1 cm diameter. Slightly ulcerative center  Multiple areas of hyperkeratosis on the face consistent with probable actinic keratoses       Assessment:     Physical exam. Patient's hypertension which is suboptimally controlled. He has multiple actinic keratosis on the face and question of squamous cell cancer left anterior shoulder. The following issues were addressed     Plan:     -Increase losartan 100 mg daily and continue close monitoring of blood pressure and reassess here within 3 months .  Consider adding low dose Amlodipine at follow up if not to goal. -Establish more consistent exercise  -Recent labs reviewed with patient  -Consider new shingles vaccine and he will check with pharmacy  -Set up dermatology referral regarding shoulder lesion and facial lesions   [name] MD [facility]

## 2018-04-15 DIAGNOSIS — R69 Illness, unspecified: Secondary | ICD-10-CM | POA: Diagnosis not present

## 2018-06-07 DIAGNOSIS — R69 Illness, unspecified: Secondary | ICD-10-CM | POA: Diagnosis not present

## 2018-07-25 DIAGNOSIS — R1030 Lower abdominal pain, unspecified: Secondary | ICD-10-CM | POA: Diagnosis not present

## 2018-07-25 DIAGNOSIS — N200 Calculus of kidney: Secondary | ICD-10-CM | POA: Diagnosis not present

## 2018-07-25 DIAGNOSIS — Z87442 Personal history of urinary calculi: Secondary | ICD-10-CM | POA: Diagnosis not present

## 2018-07-25 DIAGNOSIS — Z87891 Personal history of nicotine dependence: Secondary | ICD-10-CM | POA: Diagnosis not present

## 2018-07-25 DIAGNOSIS — R109 Unspecified abdominal pain: Secondary | ICD-10-CM | POA: Diagnosis not present

## 2018-07-25 DIAGNOSIS — I10 Essential (primary) hypertension: Secondary | ICD-10-CM | POA: Diagnosis not present

## 2018-07-25 DIAGNOSIS — R112 Nausea with vomiting, unspecified: Secondary | ICD-10-CM | POA: Diagnosis not present

## 2018-10-10 DIAGNOSIS — H25813 Combined forms of age-related cataract, bilateral: Secondary | ICD-10-CM | POA: Diagnosis not present

## 2018-12-02 ENCOUNTER — Encounter: Payer: Medicare HMO | Admitting: Family Medicine

## 2019-03-04 ENCOUNTER — Ambulatory Visit (INDEPENDENT_AMBULATORY_CARE_PROVIDER_SITE_OTHER): Payer: Medicare HMO | Admitting: Family Medicine

## 2019-03-04 ENCOUNTER — Other Ambulatory Visit: Payer: Self-pay

## 2019-03-04 ENCOUNTER — Encounter: Payer: Self-pay | Admitting: Family Medicine

## 2019-03-04 VITALS — BP 146/86 | HR 70 | Temp 97.8°F | Ht 69.25 in | Wt 191.9 lb

## 2019-03-04 DIAGNOSIS — L570 Actinic keratosis: Secondary | ICD-10-CM | POA: Diagnosis not present

## 2019-03-04 DIAGNOSIS — Z0000 Encounter for general adult medical examination without abnormal findings: Secondary | ICD-10-CM

## 2019-03-04 DIAGNOSIS — Z0001 Encounter for general adult medical examination with abnormal findings: Secondary | ICD-10-CM | POA: Diagnosis not present

## 2019-03-04 DIAGNOSIS — G2581 Restless legs syndrome: Secondary | ICD-10-CM

## 2019-03-04 LAB — HEPATIC FUNCTION PANEL
ALT: 20 U/L (ref 0–53)
AST: 14 U/L (ref 0–37)
Albumin: 4.1 g/dL (ref 3.5–5.2)
Alkaline Phosphatase: 85 U/L (ref 39–117)
Bilirubin, Direct: 0.2 mg/dL (ref 0.0–0.3)
Total Bilirubin: 0.9 mg/dL (ref 0.2–1.2)
Total Protein: 6 g/dL (ref 6.0–8.3)

## 2019-03-04 LAB — CBC WITH DIFFERENTIAL/PLATELET
Basophils Absolute: 0 10*3/uL (ref 0.0–0.1)
Basophils Relative: 0.5 % (ref 0.0–3.0)
Eosinophils Absolute: 0.1 10*3/uL (ref 0.0–0.7)
Eosinophils Relative: 1 % (ref 0.0–5.0)
HCT: 42.8 % (ref 39.0–52.0)
Hemoglobin: 14.4 g/dL (ref 13.0–17.0)
Lymphocytes Relative: 22.5 % (ref 12.0–46.0)
Lymphs Abs: 1.3 10*3/uL (ref 0.7–4.0)
MCHC: 33.7 g/dL (ref 30.0–36.0)
MCV: 93 fl (ref 78.0–100.0)
Monocytes Absolute: 0.5 10*3/uL (ref 0.1–1.0)
Monocytes Relative: 8 % (ref 3.0–12.0)
Neutro Abs: 3.9 10*3/uL (ref 1.4–7.7)
Neutrophils Relative %: 68 % (ref 43.0–77.0)
Platelets: 169 10*3/uL (ref 150.0–400.0)
RBC: 4.6 Mil/uL (ref 4.22–5.81)
RDW: 14 % (ref 11.5–15.5)
WBC: 5.8 10*3/uL (ref 4.0–10.5)

## 2019-03-04 LAB — BASIC METABOLIC PANEL
BUN: 23 mg/dL (ref 6–23)
CO2: 27 mEq/L (ref 19–32)
Calcium: 9 mg/dL (ref 8.4–10.5)
Chloride: 106 mEq/L (ref 96–112)
Creatinine, Ser: 1.41 mg/dL (ref 0.40–1.50)
GFR: 47.79 mL/min — ABNORMAL LOW (ref >60.00)
Glucose, Bld: 104 mg/dL — ABNORMAL HIGH (ref 70–99)
Potassium: 4.7 mEq/L (ref 3.5–5.1)
Sodium: 140 mEq/L (ref 135–145)

## 2019-03-04 LAB — LIPID PANEL
Cholesterol: 118 mg/dL (ref 0–200)
HDL: 39.2 mg/dL (ref >39.00)
LDL Cholesterol: 52 mg/dL (ref 0–99)
NonHDL: 78.49
Total CHOL/HDL Ratio: 3
Triglycerides: 131 mg/dL (ref 0.0–149.0)
VLDL: 26.2 mg/dL (ref 0.0–40.0)

## 2019-03-04 LAB — TSH: TSH: 1.35 u[IU]/mL (ref 0.35–4.50)

## 2019-03-04 MED ORDER — PRAMIPEXOLE DIHYDROCHLORIDE 0.125 MG PO TABS: ORAL_TABLET | ORAL | 5 refills | Status: DC

## 2019-03-04 NOTE — Progress Notes (Signed)
Subjective:     Patient ID: Ronald Flank, MD, male   DOB: Jul 05, 1932, 85 y.o.   MRN: [id number]  HPI   [name] is here for physical exam.  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  He is enjoying playing golf periodically now.  No recent specific complaints.  He has hypertension and other medical problems include history of CAD, Mnire's disease, hyperlipidemia.  He remains on losartan 100 mg daily, metoprolol 25 mg daily, Plavix, and Lipitor.  Due for lab work.  He had 1 recent fall when he was going up stairs with his hands full but otherwise no balance issues.  Has been monitoring blood pressures at home.  Quite variable readings.  Several readings 062-694 systolic with a high of 854 but has had readings as low as 110/68.  He has completed pneumonia vaccines.  No history of shingles vaccine.  He has had recent issues with some decreased sleep with restless leg type symptoms.  He states his legs feel jumpy and moving frequently during the night.  This is relieved with getting up and walking.  Starting to disrupt sleep more more.  Minimal late day use of caffeine.  Past Medical History:  Diagnosis Date  . Kidney stones   . Meniere's disease   . Palpitations    Past Surgical History:  Procedure Laterality Date  . ANGIOPLASTY     3 stents   . INGUINAL HERNIA REPAIR    . LAMINECTOMY     lumbar x 2    reports that he quit smoking about 53 years ago. His smoking use included cigarettes. He has never used smokeless tobacco. He reports that he does not drink alcohol or use drugs. family history includes Other (age of onset: 57) in his mother; Stroke (age of onset: 14) in his father. No Known Allergies   Review of Systems  Constitutional: Negative for activity change, appetite change, fatigue and fever.  HENT: Negative for congestion, ear pain and trouble swallowing.   Eyes: Negative for pain and visual disturbance.  Respiratory: Negative for cough,  shortness of breath and wheezing.   Cardiovascular: Negative for chest pain and palpitations.  Gastrointestinal: Negative for abdominal distention, abdominal pain, blood in stool, constipation, diarrhea, nausea, rectal pain and vomiting.  Endocrine: Negative for polydipsia and polyuria.  Genitourinary: Negative for dysuria, hematuria and testicular pain.  Musculoskeletal: Negative for arthralgias and joint swelling.  Skin: Negative for rash.  Neurological: Negative for dizziness, syncope, weakness and headaches.  Hematological: Negative for adenopathy.  Psychiatric/Behavioral: Positive for sleep disturbance. Negative for confusion and dysphoric mood.       Objective:   Physical Exam Constitutional:      General: He is not in acute distress.    Appearance: He is well-developed.  HENT:     Head: Normocephalic and atraumatic.     Right Ear: External ear normal.     Left Ear: External ear normal.  Eyes:     Conjunctiva/sclera: Conjunctivae normal.     Pupils: Pupils are equal, round, and reactive to light.  Neck:     Musculoskeletal: Normal range of motion and neck supple.     Thyroid: No thyromegaly.  Cardiovascular:     Rate and Rhythm: Normal rate and regular rhythm.     Heart sounds: Normal heart sounds. No murmur.  Pulmonary:     Effort: No respiratory distress.     Breath sounds: No wheezing or rales.  Abdominal:     General: Bowel sounds are normal. There is no distension.     Palpations: Abdomen is soft. There is no mass.     Tenderness: There is no abdominal tenderness. There is no guarding or rebound.  Lymphadenopathy:     Cervical: No cervical adenopathy.  Skin:    Findings: No rash.     Comments: He has multiple hyperkeratotic lesions including a couple on the anterior left leg and 1 right calf.  No ulceration.  He has several scaly lesions on the face as well.  Neurological:     Mental Status: He is alert and oriented to person, place, and time.     Cranial  Nerves: No cranial nerve deficit.     Deep Tendon Reflexes: Reflexes normal.        Assessment:     #1 physical exam.  We discussed the following health maintenance issues  #2 probable restless leg syndrome.  Symptoms becoming more disruptive to sleep recently  #3 small probable actinic keratoses involving lower extremities    Plan:     -Obtain follow-up lab work -We discussed potential treatments restless leg syndrome including Mirapex, Requip, or gabapentin.  We decided to start Mirapex 0.125 mg 1 at night and may titrate to 0.25 mg if needed -Discussed risk and benefits of liquid nitrogen therapy to leg hyperkeratotic lesions including risk of pain, blistering, infection.  Patient consented.  We treated a total of 3 lesions without difficulty and patient tolerated well -Close monitoring of blood pressure and be in touch of consistent systolic readings over 109 -Reminder for flu vaccine this fall -Discussed shingles vaccine and he will check at pharmacy if interested  [name] MD [facility] at [facility]

## 2019-04-17 DIAGNOSIS — H25813 Combined forms of age-related cataract, bilateral: Secondary | ICD-10-CM | POA: Diagnosis not present

## 2019-05-01 DIAGNOSIS — R69 Illness, unspecified: Secondary | ICD-10-CM | POA: Diagnosis not present

## 2019-06-24 DIAGNOSIS — R69 Illness, unspecified: Secondary | ICD-10-CM | POA: Diagnosis not present

## 2020-03-08 ENCOUNTER — Ambulatory Visit (INDEPENDENT_AMBULATORY_CARE_PROVIDER_SITE_OTHER): Payer: Medicare HMO | Admitting: Family Medicine

## 2020-03-08 ENCOUNTER — Encounter: Payer: Self-pay | Admitting: Family Medicine

## 2020-03-08 ENCOUNTER — Other Ambulatory Visit: Payer: Self-pay

## 2020-03-08 VITALS — BP 138/76 | HR 85 | Temp 97.7°F | Ht 69.0 in | Wt 189.7 lb

## 2020-03-08 DIAGNOSIS — Z23 Encounter for immunization: Secondary | ICD-10-CM | POA: Diagnosis not present

## 2020-03-08 DIAGNOSIS — Z0000 Encounter for general adult medical examination without abnormal findings: Secondary | ICD-10-CM

## 2020-03-08 DIAGNOSIS — I10 Essential (primary) hypertension: Secondary | ICD-10-CM | POA: Diagnosis not present

## 2020-03-08 MED ORDER — PRAMIPEXOLE DIHYDROCHLORIDE 0.25 MG PO TABS: ORAL_TABLET | ORAL | 3 refills | Status: DC

## 2020-03-08 NOTE — Patient Instructions (Signed)

## 2020-03-08 NOTE — Progress Notes (Signed)
Established Patient Office Visit  Subjective:  Patient ID: Ronald Flank, Ronald Case, male    DOB: 05/06/1932  Age: 86 y.o. MRN: [id number]  CC:  Chief Complaint  Patient presents with  . Annual Exam    Pt has no new concerns     HPI Ronald Flank, Ronald Case presents for physical exam.  He retired from [employer] few years ago.  Generally doing well.  No specific complaints.  He does have some occasional breakthrough restless leg symptoms.  He is currently taking Mirapex 0.125 mg usually 3 at night but still has breakthrough at times.  He is in Flomax for BPH symptoms and that is stable.  His blood pressures have been stable.  No history of Shingrix vaccine.  Also no history of tetanus in the past 10 years.  He would like to get tetanus today but declines Shingrix vaccine.  He has had Covid vaccine.  No recent falls.  Past medical history reviewed and unchanged  Past Medical History:  Diagnosis Date  . Kidney stones   . Meniere's disease   . Palpitations    Past Surgical History:  Procedure Laterality Date  . ANGIOPLASTY     3 stents   . INGUINAL HERNIA REPAIR    . LAMINECTOMY     lumbar x 2    reports that he quit smoking about 54 years ago. His smoking use included cigarettes. He has never used smokeless tobacco. He reports that he does not drink alcohol and does not use drugs. family history includes Other (age of onset: 45) in his mother; Stroke (age of onset: 29) in his father. No Known Allergies   Past Medical History:  Diagnosis Date  . Kidney stones   . Meniere's disease   . Palpitations     Past Surgical History:  Procedure Laterality Date  . ANGIOPLASTY     3 stents   . INGUINAL HERNIA REPAIR    . LAMINECTOMY     lumbar x 2    Family History  Problem Relation Age of Onset  . Other Mother 61       subdural hematoma- deceased  . Stroke Father 70       deceased    Social History   Socioeconomic History  . Marital status: Married    Spouse name:  Not on file  . Number of children: Not on file  . Years of education: Not on file  . Highest education level: Not on file  Occupational History  . Not on file  Tobacco Use  . Smoking status: Former Smoker    Types: Cigarettes    Quit date: 07/31/1965    Years since quitting: 54.6  . Smokeless tobacco: Never Used  Vaping Use  . Vaping Use: Never used  Substance and Sexual Activity  . Alcohol use: No  . Drug use: No  . Sexual activity: Not on file  Other Topics Concern  . Not on file  Social History Narrative  . Not on file   Social Determinants of Health   Financial Resource Strain:   . Difficulty of Paying Living Expenses:   Food Insecurity:   . Worried About Charity fundraiser in the Last Year:   . Arboriculturist in the Last Year:   Transportation Needs:   . Film/video editor (Medical):   Marland Kitchen Lack of Transportation (Non-Medical):   Physical Activity:   . Days of Exercise per Week:   . Minutes of  Exercise per Session:   Stress:   . Feeling of Stress :   Social Connections:   . Frequency of Communication with Friends and Family:   . Frequency of Social Gatherings with Friends and Family:   . Attends Religious Services:   . Active Member of Clubs or Organizations:   . Attends Archivist Meetings:   Marland Kitchen Marital Status:   Intimate Partner Violence:   . Fear of Current or Ex-Partner:   . Emotionally Abused:   Marland Kitchen Physically Abused:   . Sexually Abused:     Outpatient Medications Prior to Visit  Medication Sig Dispense Refill  . aspirin 325 MG tablet Take 325 mg by mouth daily.      Marland Kitchen atorvastatin (LIPITOR) 40 MG tablet Take 1 tablet (40 mg total) by mouth daily. 30 tablet 3  . clopidogrel (PLAVIX) 75 MG tablet Take 75 mg by mouth daily.      . fexofenadine (ALLEGRA) 60 MG tablet Take 60 mg by mouth as needed for allergies or rhinitis.    Marland Kitchen losartan (COZAAR) 100 MG tablet Take 1 tablet (100 mg total) by mouth daily. 90 tablet 3  . metoprolol succinate  (TOPROL-XL) 25 MG 24 hr tablet Take 25 mg by mouth daily.      . Naproxen Sodium (ALEVE) 220 MG CAPS Take by mouth as needed.      . nitroGLYCERIN (NITROSTAT) 0.4 MG SL tablet Place 0.4 mg under the tongue every 5 (five) minutes as needed.      . tamsulosin (FLOMAX) 0.4 MG CAPS capsule     . pramipexole (MIRAPEX) 0.125 MG tablet Take one to two at night for restless leg symptoms 60 tablet 5   No facility-administered medications prior to visit.    No Known Allergies  ROS Review of Systems  Constitutional: Negative for activity change, appetite change, fatigue, fever and unexpected weight change.  HENT: Negative for congestion, ear pain and trouble swallowing.   Eyes: Negative for pain and visual disturbance.  Respiratory: Negative for cough, shortness of breath and wheezing.   Cardiovascular: Negative for chest pain and palpitations.  Gastrointestinal: Negative for abdominal distention, abdominal pain, blood in stool, constipation, diarrhea, nausea, rectal pain and vomiting.  Genitourinary: Negative for dysuria, hematuria and testicular pain.  Musculoskeletal: Negative for arthralgias and joint swelling.  Skin: Negative for rash.  Neurological: Negative for dizziness, syncope and headaches.  Hematological: Negative for adenopathy.  Psychiatric/Behavioral: Negative for confusion and dysphoric mood.      Objective:    Physical Exam Constitutional:      General: He is not in acute distress.    Appearance: He is well-developed.  HENT:     Head: Normocephalic and atraumatic.     Right Ear: External ear normal.     Left Ear: External ear normal.  Eyes:     Conjunctiva/sclera: Conjunctivae normal.     Pupils: Pupils are equal, round, and reactive to light.  Neck:     Thyroid: No thyromegaly.  Cardiovascular:     Rate and Rhythm: Normal rate and regular rhythm.     Heart sounds: Normal heart sounds. No murmur heard.   Pulmonary:     Effort: No respiratory distress.     Breath  sounds: No wheezing or rales.  Abdominal:     General: Bowel sounds are normal. There is no distension.     Palpations: Abdomen is soft. There is no mass.     Tenderness: There is no abdominal tenderness. There  is no guarding or rebound.  Musculoskeletal:     Cervical back: Normal range of motion and neck supple.  Lymphadenopathy:     Cervical: No cervical adenopathy.  Skin:    Comments: He has multiple areas of hyperkeratosis on the face consistent with probable actinic keratoses.  He also has multiple scattered seborrheic keratoses on his back  Neurological:     Mental Status: He is alert and oriented to person, place, and time.     Cranial Nerves: No cranial nerve deficit.     Deep Tendon Reflexes: Reflexes normal.     BP 138/76 (BP Location: Left Arm, Patient Position: Sitting, Cuff Size: Normal)   Pulse 85   Temp 97.7 F (36.5 C) (Oral)   Ht 5\' 9"  (1.753 m)   Wt 189 lb 11.2 oz (86 kg)   SpO2 97%   BMI 28.01 kg/m  Wt Readings from Last 3 Encounters:  03/08/20 189 lb 11.2 oz (86 kg)  03/04/19 191 lb 14.4 oz (87 kg)  11/28/17 197 lb 4.8 oz (89.5 kg)     Health Maintenance Due  Topic Date Due  . INFLUENZA VACCINE  02/29/2020    There are no preventive care reminders to display for this patient.  Lab Results  Component Value Date   TSH 1.35 03/04/2019   Lab Results  Component Value Date   WBC 5.8 03/04/2019   HGB 14.4 03/04/2019   HCT 42.8 03/04/2019   MCV 93.0 03/04/2019   PLT 169.0 03/04/2019   Lab Results  Component Value Date   NA 140 03/04/2019   K 4.7 03/04/2019   CO2 27 03/04/2019   GLUCOSE 104 (H) 03/04/2019   BUN 23 03/04/2019   CREATININE 1.41 03/04/2019   BILITOT 0.9 03/04/2019   ALKPHOS 85 03/04/2019   AST 14 03/04/2019   ALT 20 03/04/2019   PROT 6.0 03/04/2019   ALBUMIN 4.1 03/04/2019   CALCIUM 9.0 03/04/2019   GFR 47.79 (L) 03/04/2019   Lab Results  Component Value Date   CHOL 118 03/04/2019   Lab Results  Component Value Date     HDL 39.20 03/04/2019   Lab Results  Component Value Date   LDLCALC 52 03/04/2019   Lab Results  Component Value Date   TRIG 131.0 03/04/2019   Lab Results  Component Value Date   CHOLHDL 3 03/04/2019   No results found for: HGBA1C    Assessment & Plan:   Problem List Items Addressed This Visit    None    Visit Diagnoses    Physical exam    -  Primary   Relevant Orders   Basic metabolic panel   Lipid panel   CBC with Differential/Platelet   TSH   Hepatic function panel   Need for Tdap vaccination       Relevant Orders   Tdap vaccine greater than or equal to 7yo IM (Completed)    -We discussed tetanus booster and he requests this today.  Tdap given.  -Discussed Shingrix vaccine and he declines at this time but will consider  -Covid vaccine already given  -Set up screening lab work  -We discussed setting up dermatology referral as he is at high risk for skin cancer with his skin type  -We discussed increasing his Mirapex to 0.25 mg 2 nightly  Meds ordered this encounter  Medications  . pramipexole (MIRAPEX) 0.25 MG tablet    Sig: Take one to two po qhs prn restless leg symptoms    Dispense:  180 tablet    Refill:  3    Follow-up: No follow-ups on file.    Carolann Littler, Ronald Case

## 2020-03-09 LAB — HEPATIC FUNCTION PANEL
AG Ratio: 2 (calc) (ref 1.0–2.5)
ALT: 17 U/L (ref 9–46)
AST: 15 U/L (ref 10–35)
Albumin: 4.1 g/dL (ref 3.6–5.1)
Alkaline phosphatase (APISO): 91 U/L (ref 35–144)
Bilirubin, Direct: 0.2 mg/dL (ref 0.0–0.2)
Globulin: 2.1 g/dL (ref 1.9–3.7)
Indirect Bilirubin: 0.7 mg/dL (ref 0.2–1.2)
Total Bilirubin: 0.9 mg/dL (ref 0.2–1.2)
Total Protein: 6.2 g/dL (ref 6.1–8.1)

## 2020-03-09 LAB — CBC WITH DIFFERENTIAL/PLATELET
Absolute Monocytes: 531 {cells}/uL (ref 200–950)
Basophils Absolute: 31 {cells}/uL (ref 0–200)
Basophils Relative: 0.5 %
Eosinophils Absolute: 92 {cells}/uL (ref 15–500)
Eosinophils Relative: 1.5 %
HCT: 43.9 % (ref 38.5–50.0)
Hemoglobin: 15.1 g/dL (ref 13.2–17.1)
Lymphs Abs: 1818 {cells}/uL (ref 850–3900)
MCH: 32.5 pg (ref 27.0–33.0)
MCHC: 34.4 g/dL (ref 32.0–36.0)
MCV: 94.4 fL (ref 80.0–100.0)
MPV: 10 fL (ref 7.5–12.5)
Monocytes Relative: 8.7 %
Neutro Abs: 3630 {cells}/uL (ref 1500–7800)
Neutrophils Relative %: 59.5 %
Platelets: 181 Thousand/uL (ref 140–400)
RBC: 4.65 Million/uL (ref 4.20–5.80)
RDW: 13.9 % (ref 11.0–15.0)
Total Lymphocyte: 29.8 %
WBC: 6.1 Thousand/uL (ref 3.8–10.8)

## 2020-03-09 LAB — LIPID PANEL
Cholesterol: 214 mg/dL — ABNORMAL HIGH (ref <200)
HDL: 42 mg/dL (ref >=40)
LDL Cholesterol (Calc): 133 mg/dL (calc) — ABNORMAL HIGH
Non-HDL Cholesterol (Calc): 172 mg/dL (calc) — ABNORMAL HIGH (ref <130)
Total CHOL/HDL Ratio: 5.1 (calc) — ABNORMAL HIGH (ref <5.0)
Triglycerides: 243 mg/dL — ABNORMAL HIGH (ref <150)

## 2020-03-09 LAB — BASIC METABOLIC PANEL
BUN/Creatinine Ratio: 20 (calc) (ref 6–22)
BUN: 26 mg/dL — ABNORMAL HIGH (ref 7–25)
CO2: 22 mmol/L (ref 20–32)
Calcium: 9 mg/dL (ref 8.6–10.3)
Chloride: 109 mmol/L (ref 98–110)
Creat: 1.33 mg/dL — ABNORMAL HIGH (ref 0.70–1.11)
Glucose, Bld: 98 mg/dL (ref 65–99)
Potassium: 4.5 mmol/L (ref 3.5–5.3)
Sodium: 141 mmol/L (ref 135–146)

## 2020-03-09 LAB — TSH: TSH: 1.73 mIU/L (ref 0.40–4.50)

## 2020-05-12 DIAGNOSIS — R69 Illness, unspecified: Secondary | ICD-10-CM | POA: Diagnosis not present

## 2020-05-27 DIAGNOSIS — L821 Other seborrheic keratosis: Secondary | ICD-10-CM | POA: Diagnosis not present

## 2020-05-27 DIAGNOSIS — L814 Other melanin hyperpigmentation: Secondary | ICD-10-CM | POA: Diagnosis not present

## 2020-05-27 DIAGNOSIS — Z85828 Personal history of other malignant neoplasm of skin: Secondary | ICD-10-CM | POA: Diagnosis not present

## 2020-05-27 DIAGNOSIS — L570 Actinic keratosis: Secondary | ICD-10-CM | POA: Diagnosis not present

## 2020-07-07 DIAGNOSIS — R69 Illness, unspecified: Secondary | ICD-10-CM | POA: Diagnosis not present

## 2020-07-09 ENCOUNTER — Telehealth: Payer: Self-pay | Admitting: Family Medicine

## 2020-07-09 DIAGNOSIS — L570 Actinic keratosis: Secondary | ICD-10-CM | POA: Diagnosis not present

## 2020-07-09 DIAGNOSIS — Z85828 Personal history of other malignant neoplasm of skin: Secondary | ICD-10-CM | POA: Diagnosis not present

## 2020-07-09 DIAGNOSIS — L578 Other skin changes due to chronic exposure to nonionizing radiation: Secondary | ICD-10-CM | POA: Diagnosis not present

## 2020-07-09 NOTE — Progress Notes (Signed)
  Chronic Care Management   Note  07/09/2020 Name: Kimi Kroft, MD MRN: [id number] DOB: 11-07-1931  Worthy Flank, MD is a 86 y.o. year old male who is a primary care patient of Burchette, Alinda Sierras, MD. I reached out to Worthy Flank, MD by phone today in response to a referral sent by Mr. Worthy Flank, MD's PCP, Elease Hashimoto, Alinda Sierras, MD.   Mr. Giammarco was given information about Chronic Care Management services today including:  1. CCM service includes personalized support from designated clinical staff supervised by his physician, including individualized plan of care and coordination with other care providers 2. 24/7 contact phone numbers for assistance for urgent and routine care needs. 3. Service will only be billed when office clinical staff spend 20 minutes or more in a month to coordinate care. 4. Only one practitioner may furnish and bill the service in a calendar month. 5. The patient may stop CCM services at any time (effective at the end of the month) by phone call to the office staff.   Patient agreed to services and verbal consent obtained.   Follow up plan:   Carley Perdue [employer] Scheduler

## 2020-07-12 NOTE — Progress Notes (Signed)
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  Attempted to complete virtual video visit multiple times. Patient was unable to log onto the platform. Visit changed to a telephone visit  I connected with Ronald Case today by telephone and verified that I am speaking with the correct person using two identifiers. Location patient: home Location provider: work Persons participating in the virtual visit: patient, provider.   I discussed the limitations, risks, security and privacy concerns of performing an evaluation and management service by telephone and the availability of in person appointments. I also discussed with the patient that there may be a patient responsible charge related to this service. The patient expressed understanding and verbally consented to this telephonic visit.    Interactive audio and video telecommunications were attempted between this provider and patient, however failed, due to patient having technical difficulties OR patient did not have access to video capability.  We continued and completed visit with audio only.      Review of Systems    N/A  Cardiac Risk Factors include: advanced age (>32men, >37 women);dyslipidemia;hypertension;male gender     Objective:    Today's Vitals   There is no height or weight on file to calculate BMI.  Advanced Directives 07/13/2020  Does Patient Have a Medical Advance Directive? Yes  Type of Paramedic of [place];Living will  Does patient want to make changes to medical advance directive? No - Patient declined  Copy of [place] in Chart? No - copy requested    Current Medications (verified) Outpatient Encounter Medications as of 07/13/2020  Medication Sig  . aspirin 325 MG tablet Take 325 mg by mouth daily.  Marland Kitchen atorvastatin (LIPITOR) 40 MG tablet Take 1 tablet (40 mg total) by mouth daily.  .  clopidogrel (PLAVIX) 75 MG tablet Take 75 mg by mouth daily.  . fexofenadine (ALLEGRA) 60 MG tablet Take 60 mg by mouth as needed for allergies or rhinitis.  Marland Kitchen losartan (COZAAR) 100 MG tablet Take 1 tablet (100 mg total) by mouth daily.  . metoprolol succinate (TOPROL-XL) 25 MG 24 hr tablet Take 25 mg by mouth daily.  . Naproxen Sodium 220 MG CAPS Take by mouth as needed.  . pramipexole (MIRAPEX) 0.25 MG tablet Take one to two po qhs prn restless leg symptoms  . tamsulosin (FLOMAX) 0.4 MG CAPS capsule   . nitroGLYCERIN (NITROSTAT) 0.4 MG SL tablet Place 0.4 mg under the tongue every 5 (five) minutes as needed.   (Patient not taking: Reported on 07/13/2020)   No facility-administered encounter medications on file as of 07/13/2020.    Allergies (verified) Patient has no known allergies.   History: Past Medical History:  Diagnosis Date  . Kidney stones   . Meniere's disease   . Palpitations    Past Surgical History:  Procedure Laterality Date  . ANGIOPLASTY     3 stents   . INGUINAL HERNIA REPAIR    . LAMINECTOMY     lumbar x 2   Family History  Problem Relation Age of Onset  . Other Mother 62       subdural hematoma- deceased  . Stroke Father 87       deceased   Social History   Socioeconomic History  . Marital status: Married    Spouse name: Not on file  . Number of children: Not on file  . Years of education: Not on  file  . Highest education level: Not on file  Occupational History  . Not on file  Tobacco Use  . Smoking status: Former Smoker    Types: Cigarettes    Quit date: 07/31/1965    Years since quitting: 54.9  . Smokeless tobacco: Never Used  Vaping Use  . Vaping Use: Never used  Substance and Sexual Activity  . Alcohol use: No  . Drug use: No  . Sexual activity: Not on file  Other Topics Concern  . Not on file  Social History Narrative  . Not on file   Social Determinants of Health   Financial Resource Strain: Low Risk   . Difficulty of Paying  Living Expenses: Not hard at all  Food Insecurity: No Food Insecurity  . Worried About Charity fundraiser in the Last Year: Never true  . Ran Out of Food in the Last Year: Never true  Transportation Needs: No Transportation Needs  . Lack of Transportation (Medical): No  . Lack of Transportation (Non-Medical): No  Physical Activity: Inactive  . Days of Exercise per Week: 0 days  . Minutes of Exercise per Session: 0 min  Stress: No Stress Concern Present  . Feeling of Stress : Not at all  Social Connections: Socially Integrated  . Frequency of Communication with Friends and Family: More than three times a week  . Frequency of Social Gatherings with Friends and Family: More than three times a week  . Attends Religious Services: 1 to 4 times per year  . Active Member of Clubs or Organizations: Yes  . Attends Archivist Meetings: Never  . Marital Status: Married    Tobacco Counseling Counseling given: Not Answered   Clinical Intake:  Pre-visit preparation completed: Yes  Pain : No/denies pain     Nutritional Risks: None Diabetes: No  How often do you need to have someone help you when you read instructions, pamphlets, or other written materials from your doctor or pharmacy?: 1 - Never What is the last grade level you completed in school?: Medical School  Diabetic?No   Interpreter Needed?: No  Information entered by :: [place] of Daily Living In your present state of health, do you have any difficulty performing the following activities: 07/13/2020  Hearing? N  Vision? N  Difficulty concentrating or making decisions? N  Walking or climbing stairs? N  Dressing or bathing? N  Doing errands, shopping? N  Preparing Food and eating ? N  Using the Toilet? N  In the past six months, have you accidently leaked urine? N  Do you have problems with loss of bowel control? N  Managing your Medications? N  Managing your Finances? N  Housekeeping or  managing your Housekeeping? N  Some recent data might be hidden    Patient Care Team: [name], MD as PCP - General (Family Medicine) Viona Gilmore, [facility] (Pharmacist) Viona Gilmore, [facility] as Pharmacist (Pharmacist)  Indicate any recent Medical Services you may have received from other than [facility] providers in the past year (date may be approximate).     Assessment:   This is a routine wellness examination for Ronald Case.  Hearing/Vision screen  Hearing Screening   125Hz  250Hz  500Hz  1000Hz  2000Hz  3000Hz  4000Hz  6000Hz  8000Hz   Right ear:           Left ear:           Vision Screening Comments: Patient states gets eyes examined yearly.   Dietary issues  and exercise activities discussed: Current Exercise Habits: The patient does not participate in regular exercise at present  Goals    . Exercise 3x per week (30 min per time)    . Patient Stated     I will continue to play golf       Depression Screen PHQ 2/9 Scores 07/13/2020 03/08/2020 03/04/2019  PHQ - 2 Score 0 0 0  PHQ- 9 Score 0 - 0    Fall Risk Fall Risk  07/13/2020 03/08/2020 03/04/2019  Falls in the past year? 1 0 1  Number falls in past yr: 0 0 0  Injury with Fall? 0 0 0  Risk for fall due to : History of fall(s) - -  Follow up Falls evaluation completed;Falls prevention discussed - -    FALL RISK PREVENTION PERTAINING TO THE HOME:  Any stairs in or around the home? Yes  If so, are there any without handrails? No  Home free of loose throw rugs in walkways, pet beds, electrical cords, etc? Yes  Adequate lighting in your home to reduce risk of falls? Yes   ASSISTIVE DEVICES UTILIZED TO PREVENT FALLS:  Life alert? No  Use of a cane, walker or w/c? No  Grab bars in the bathroom? No  Shower chair or bench in shower? Yes  Elevated toilet seat or a handicapped toilet? Yes    Cognitive Function:   Normal cognitive status assessed by direct observation by this Nurse Health Advisor. No abnormalities found.         Immunizations Immunization History  Administered Date(s) Administered  . Influenza-Unspecified 07/31/2016  . PFIZER SARS-COV-2 Vaccination 08/08/2019, 09/02/2019  . Pneumococcal Conjugate-13 07/31/2016  . Pneumococcal Polysaccharide-23 03/15/2010  . Tdap 03/08/2020    TDAP status: Up to date  Flu Vaccine status: Up to date  Pneumococcal vaccine status: Up to date  Covid-19 vaccine status: Completed vaccines  Qualifies for Shingles Vaccine? Yes   Zostavax completed No   Shingrix Completed?: No.    Education has been provided regarding the importance of this vaccine. Patient has been advised to call insurance company to determine out of pocket expense if they have not yet received this vaccine. Advised may also receive vaccine at local pharmacy or Health Dept. Verbalized acceptance and understanding.  Screening Tests Health Maintenance  Topic Date Due  . COVID-19 Vaccine (3 - Pfizer risk 4-dose series) 09/30/2019  . INFLUENZA VACCINE  02/29/2020  . TETANUS/TDAP  03/08/2030  . PNA vac Low Risk Adult  Completed    Health Maintenance  Health Maintenance Due  Topic Date Due  . COVID-19 Vaccine (3 - Pfizer risk 4-dose series) 09/30/2019  . INFLUENZA VACCINE  02/29/2020    Colorectal cancer screening: No longer required.   Lung Cancer Screening: (Low Dose CT Chest recommended if Age 102-80 years, 30 pack-year currently smoking OR have quit w/in 15years.) does not qualify.   Lung Cancer Screening Referral: N/A   Additional Screening:  Hepatitis C Screening: does not qualify;   Vision Screening: Recommended annual ophthalmology exams for early detection of glaucoma and other disorders of the eye. Is the patient up to date with their annual eye exam?  Yes  Who is the provider or what is the name of the office in which the patient attends annual eye exams? Dr. Bing Plume If pt is not established with a provider, would they like to be referred to a provider to establish  care? No .   Dental Screening: Recommended annual dental exams for  proper oral hygiene  Community Resource Referral / Chronic Care Management: CRR required this visit?  No   CCM required this visit?  No      Plan:     I have personally reviewed and noted the following in the patient's chart:   . Medical and social history . Use of alcohol, tobacco or illicit drugs  . Current medications and supplements . Functional ability and status . Nutritional status . Physical activity . Advanced directives . List of other physicians . Hospitalizations, surgeries, and ER visits in previous 12 months . Vitals . Screenings to include cognitive, depression, and falls . Referrals and appointments  In addition, I have reviewed and discussed with patient certain preventive protocols, quality metrics, and best practice recommendations. A written personalized care plan for preventive services as well as general preventive health recommendations were provided to patient.     Ofilia Neas, LPN   [id number]   Nurse Notes: None

## 2020-07-13 ENCOUNTER — Other Ambulatory Visit: Payer: Self-pay

## 2020-07-13 ENCOUNTER — Telehealth: Payer: Self-pay | Admitting: Family Medicine

## 2020-07-13 ENCOUNTER — Ambulatory Visit (INDEPENDENT_AMBULATORY_CARE_PROVIDER_SITE_OTHER): Payer: Medicare HMO

## 2020-07-13 DIAGNOSIS — Z0000 Encounter for general adult medical examination without abnormal findings: Secondary | ICD-10-CM

## 2020-07-13 NOTE — Progress Notes (Signed)
  Chronic Care Management   Outreach Note  07/13/2020 Name: Ronald Bogie, MD MRN: [id number] DOB: [birth year]  Referred by: [name], MD Reason for referral : No chief complaint on file.   A second unsuccessful telephone outreach was attempted today. The patient was referred to pharmacist for assistance with care management and care coordination.  Follow Up Plan:   Carley Perdue [employer] Scheduler

## 2020-07-13 NOTE — Patient Instructions (Signed)
Ronald Case , Thank you for taking time to come for your Medicare Wellness Visit. I appreciate your ongoing commitment to your health goals. Please review the following plan we discussed and let me know if I can assist you in the future.   Screening recommendations/referrals: Colonoscopy: No longer required  Recommended yearly ophthalmology/optometry visit for glaucoma screening and checkup Recommended yearly dental visit for hygiene and checkup  Vaccinations: Influenza vaccine: Up to date, next due fall 2022  Pneumococcal vaccine: Completed series  Tdap vaccine: Up to date, next due 03/08/2030 Shingles vaccine: Currently due for Shingrix, if you change your mind and wish to receive we recommend that you do so at your pharmacy as it is less expensive.     Advanced directives: Please bring copies of your advanced medical directives into our office so that we may scan them into your chart.   Conditions/risks identified: None   Next appointment: None   Preventive Care 65 Years and Older, Male Preventive care refers to lifestyle choices and visits with your health care provider that can promote health and wellness. What does preventive care include?  A yearly physical exam. This is also called an annual well check.  Dental exams once or twice a year.  Routine eye exams. Ask your health care provider how often you should have your eyes checked.  Personal lifestyle choices, including:  Daily care of your teeth and gums.  Regular physical activity.  Eating a healthy diet.  Avoiding tobacco and drug use.  Limiting alcohol use.  Practicing safe sex.  Taking low doses of aspirin every day.  Taking vitamin and mineral supplements as recommended by your health care provider. What happens during an annual well check? The services and screenings done by your health care provider during your annual well check will depend on your age, overall health, lifestyle risk factors, and family  history of disease. Counseling  Your health care provider may ask you questions about your:  Alcohol use.  Tobacco use.  Drug use.  Emotional well-being.  Home and relationship well-being.  Sexual activity.  Eating habits.  History of falls.  Memory and ability to understand (cognition).  Work and work Statistician. Screening  You may have the following tests or measurements:  Height, weight, and BMI.  Blood pressure.  Lipid and cholesterol levels. These may be checked every 5 years, or more frequently if you are over 24 years old.  Skin check.  Lung cancer screening. You may have this screening every year starting at age 22 if you have a 30-pack-year history of smoking and currently smoke or have quit within the past 15 years.  Fecal occult blood test (FOBT) of the stool. You may have this test every year starting at age 52.  Flexible sigmoidoscopy or colonoscopy. You may have a sigmoidoscopy every 5 years or a colonoscopy every 10 years starting at age 63.  Prostate cancer screening. Recommendations will vary depending on your family history and other risks.  Hepatitis C blood test.  Hepatitis B blood test.  Sexually transmitted disease (STD) testing.  Diabetes screening. This is done by checking your blood sugar (glucose) after you have not eaten for a while (fasting). You may have this done every 1-3 years.  Abdominal aortic aneurysm (AAA) screening. You may need this if you are a current or former smoker.  Osteoporosis. You may be screened starting at age 38 if you are at high risk. Talk with your health care provider about your test results,  treatment options, and if necessary, the need for more tests. Vaccines  Your health care provider may recommend certain vaccines, such as:  Influenza vaccine. This is recommended every year.  Tetanus, diphtheria, and acellular pertussis (Tdap, Td) vaccine. You may need a Td booster every 10 years.  Zoster vaccine.  You may need this after age 59.  Pneumococcal 13-valent conjugate (PCV13) vaccine. One dose is recommended after age 29.  Pneumococcal polysaccharide (PPSV23) vaccine. One dose is recommended after age 32. Talk to your health care provider about which screenings and vaccines you need and how often you need them. This information is not intended to replace advice given to you by your health care provider. Make sure you discuss any questions you have with your health care provider. Document Released: 08/13/2015 Document Revised: 04/05/2016 Document Reviewed: 05/18/2015 Elsevier Interactive Patient Education  2017 [place] Prevention in the Home Falls can cause injuries. They can happen to people of all ages. There are many things you can do to make your home safe and to help prevent falls. What can I do on the outside of my home?  Regularly fix the edges of walkways and driveways and fix any cracks.  Remove anything that might make you trip as you walk through a door, such as a raised step or threshold.  Trim any bushes or trees on the path to your home.  Use bright outdoor lighting.  Clear any walking paths of anything that might make someone trip, such as rocks or tools.  Regularly check to see if handrails are loose or broken. Make sure that both sides of any steps have handrails.  Any raised decks and porches should have guardrails on the edges.  Have any leaves, snow, or ice cleared regularly.  Use sand or salt on walking paths during winter.  Clean up any spills in your garage right away. This includes oil or grease spills. What can I do in the bathroom?  Use night lights.  Install grab bars by the toilet and in the tub and shower. Do not use towel bars as grab bars.  Use non-skid mats or decals in the tub or shower.  If you need to sit down in the shower, use a plastic, non-slip stool.  Keep the floor dry. Clean up any water that spills on the floor as soon  as it happens.  Remove soap buildup in the tub or shower regularly.  Attach bath mats securely with double-sided non-slip rug tape.  Do not have throw rugs and other things on the floor that can make you trip. What can I do in the bedroom?  Use night lights.  Make sure that you have a light by your bed that is easy to reach.  Do not use any sheets or blankets that are too big for your bed. They should not hang down onto the floor.  Have a firm chair that has side arms. You can use this for support while you get dressed.  Do not have throw rugs and other things on the floor that can make you trip. What can I do in the kitchen?  Clean up any spills right away.  Avoid walking on wet floors.  Keep items that you use a lot in easy-to-reach places.  If you need to reach something above you, use a strong step stool that has a grab bar.  Keep electrical cords out of the way.  Do not use floor polish or wax that makes floors  slippery. If you must use wax, use non-skid floor wax.  Do not have throw rugs and other things on the floor that can make you trip. What can I do with my stairs?  Do not leave any items on the stairs.  Make sure that there are handrails on both sides of the stairs and use them. Fix handrails that are broken or loose. Make sure that handrails are as long as the stairways.  Check any carpeting to make sure that it is firmly attached to the stairs. Fix any carpet that is loose or worn.  Avoid having throw rugs at the top or bottom of the stairs. If you do have throw rugs, attach them to the floor with carpet tape.  Make sure that you have a light switch at the top of the stairs and the bottom of the stairs. If you do not have them, ask someone to add them for you. What else can I do to help prevent falls?  Wear shoes that:  Do not have high heels.  Have rubber bottoms.  Are comfortable and fit you well.  Are closed at the toe. Do not wear sandals.  If  you use a stepladder:  Make sure that it is fully opened. Do not climb a closed stepladder.  Make sure that both sides of the stepladder are locked into place.  Ask someone to hold it for you, if possible.  Clearly mark and make sure that you can see:  Any grab bars or handrails.  First and last steps.  Where the edge of each step is.  Use tools that help you move around (mobility aids) if they are needed. These include:  Canes.  Walkers.  Scooters.  Crutches.  Turn on the lights when you go into a dark area. Replace any light bulbs as soon as they burn out.  Set up your furniture so you have a clear path. Avoid moving your furniture around.  If any of your floors are uneven, fix them.  If there are any pets around you, be aware of where they are.  Review your medicines with your doctor. Some medicines can make you feel dizzy. This can increase your chance of falling. Ask your doctor what other things that you can do to help prevent falls. This information is not intended to replace advice given to you by your health care provider. Make sure you discuss any questions you have with your health care provider. Document Released: 05/13/2009 Document Revised: 12/23/2015 Document Reviewed: 08/21/2014 Elsevier Interactive Patient Education  2017 [employer].

## 2020-08-13 ENCOUNTER — Telehealth: Payer: Self-pay | Admitting: Family Medicine

## 2020-08-13 NOTE — Progress Notes (Signed)
  Chronic Care Management   Outreach Note  08/13/2020 Name: Everitt Wenner, MD MRN: [id number] DOB: 03-Jun-1932  Referred by: [name], MD Reason for referral : No chief complaint on file.   Third unsuccessful telephone outreach was attempted today. The patient was referred to the pharmacist for assistance with care management and care coordination.   Follow Up Plan:   Carley Perdue [employer] Scheduler

## 2020-11-08 DIAGNOSIS — L578 Other skin changes due to chronic exposure to nonionizing radiation: Secondary | ICD-10-CM | POA: Diagnosis not present

## 2020-11-08 DIAGNOSIS — L570 Actinic keratosis: Secondary | ICD-10-CM | POA: Diagnosis not present

## 2020-11-08 DIAGNOSIS — Z85828 Personal history of other malignant neoplasm of skin: Secondary | ICD-10-CM | POA: Diagnosis not present

## 2020-12-06 DIAGNOSIS — H25813 Combined forms of age-related cataract, bilateral: Secondary | ICD-10-CM | POA: Diagnosis not present

## 2020-12-06 DIAGNOSIS — H35033 Hypertensive retinopathy, bilateral: Secondary | ICD-10-CM | POA: Diagnosis not present

## 2021-03-08 ENCOUNTER — Other Ambulatory Visit: Payer: Self-pay

## 2021-03-09 ENCOUNTER — Encounter: Payer: Self-pay | Admitting: Family Medicine

## 2021-03-09 ENCOUNTER — Ambulatory Visit (INDEPENDENT_AMBULATORY_CARE_PROVIDER_SITE_OTHER): Payer: Medicare HMO | Admitting: Family Medicine

## 2021-03-09 VITALS — BP 160/80 | HR 65 | Temp 97.9°F | Ht 69.0 in | Wt 191.3 lb

## 2021-03-09 DIAGNOSIS — Z0000 Encounter for general adult medical examination without abnormal findings: Secondary | ICD-10-CM | POA: Diagnosis not present

## 2021-03-09 LAB — CBC WITH DIFFERENTIAL/PLATELET
Basophils Absolute: 0 10*3/uL (ref 0.0–0.1)
Basophils Relative: 0.5 % (ref 0.0–3.0)
Eosinophils Absolute: 0.1 10*3/uL (ref 0.0–0.7)
Eosinophils Relative: 1.8 % (ref 0.0–5.0)
HCT: 42.2 % (ref 39.0–52.0)
Hemoglobin: 14.2 g/dL (ref 13.0–17.0)
Lymphocytes Relative: 28.3 % (ref 12.0–46.0)
Lymphs Abs: 1.7 10*3/uL (ref 0.7–4.0)
MCHC: 33.7 g/dL (ref 30.0–36.0)
MCV: 93.9 fl (ref 78.0–100.0)
Monocytes Absolute: 0.5 10*3/uL (ref 0.1–1.0)
Monocytes Relative: 8 % (ref 3.0–12.0)
Neutro Abs: 3.6 10*3/uL (ref 1.4–7.7)
Neutrophils Relative %: 61.4 % (ref 43.0–77.0)
Platelets: 198 10*3/uL (ref 150.0–400.0)
RBC: 4.49 Mil/uL (ref 4.22–5.81)
RDW: 13.8 % (ref 11.5–15.5)
WBC: 5.9 10*3/uL (ref 4.0–10.5)

## 2021-03-09 LAB — HEPATIC FUNCTION PANEL
ALT: 18 U/L (ref 0–53)
AST: 18 U/L (ref 0–37)
Albumin: 4 g/dL (ref 3.5–5.2)
Alkaline Phosphatase: 85 U/L (ref 39–117)
Bilirubin, Direct: 0.1 mg/dL (ref 0.0–0.3)
Total Bilirubin: 0.9 mg/dL (ref 0.2–1.2)
Total Protein: 6.1 g/dL (ref 6.0–8.3)

## 2021-03-09 LAB — LIPID PANEL
Cholesterol: 224 mg/dL — ABNORMAL HIGH (ref 0–200)
HDL: 40.3 mg/dL (ref >39.00)
NonHDL: 183.74
Total CHOL/HDL Ratio: 6
Triglycerides: 246 mg/dL — ABNORMAL HIGH (ref 0.0–149.0)
VLDL: 49.2 mg/dL — ABNORMAL HIGH (ref 0.0–40.0)

## 2021-03-09 LAB — BASIC METABOLIC PANEL
BUN: 26 mg/dL — ABNORMAL HIGH (ref 6–23)
CO2: 23 mEq/L (ref 19–32)
Calcium: 8.7 mg/dL (ref 8.4–10.5)
Chloride: 106 mEq/L (ref 96–112)
Creatinine, Ser: 1.24 mg/dL (ref 0.40–1.50)
GFR: 52.52 mL/min — ABNORMAL LOW (ref >60.00)
Glucose, Bld: 81 mg/dL (ref 70–99)
Potassium: 4.3 mEq/L (ref 3.5–5.1)
Sodium: 138 mEq/L (ref 135–145)

## 2021-03-09 LAB — TSH: TSH: 1.52 u[IU]/mL (ref 0.35–5.50)

## 2021-03-09 LAB — LDL CHOLESTEROL, DIRECT: Direct LDL: 52 mg/dL

## 2021-03-09 MED ORDER — LOSARTAN POTASSIUM 100 MG PO TABS: 100.0000 mg | ORAL_TABLET | Freq: Every day | ORAL | 3 refills | Status: DC

## 2021-03-09 MED ORDER — PRAMIPEXOLE DIHYDROCHLORIDE 0.25 MG PO TABS: ORAL_TABLET | ORAL | 3 refills | Status: DC

## 2021-03-09 MED ORDER — TAMSULOSIN HCL 0.4 MG PO CAPS: 0.4000 mg | ORAL_CAPSULE | Freq: Every day | ORAL | 3 refills | Status: DC

## 2021-03-09 MED ORDER — CLOPIDOGREL BISULFATE 75 MG PO TABS: 75.0000 mg | ORAL_TABLET | Freq: Every day | ORAL | 3 refills | Status: DC

## 2021-03-09 MED ORDER — ATORVASTATIN CALCIUM 40 MG PO TABS: 40.0000 mg | ORAL_TABLET | Freq: Every day | ORAL | 1 refills | Status: DC

## 2021-03-09 MED ORDER — METOPROLOL SUCCINATE ER 25 MG PO TB24: 25.0000 mg | ORAL_TABLET | Freq: Every day | ORAL | 3 refills | Status: DC

## 2021-03-09 NOTE — Patient Instructions (Signed)
Consider Shingrix vaccine.  Monitor blood pressure and be in touch if consistently > 140/90.

## 2021-03-09 NOTE — Progress Notes (Signed)
Established Patient Office Visit  Subjective:  Patient ID: Ronald Flank, MD, male    DOB: March 13, 1932  Age: 87 y.o. MRN: [id number]  CC:  Chief Complaint  Patient presents with   Annual Exam    No new concerns     HPI Ronald Flank, MD presents for physical exam.  Retired from practicing OB/GYN about 6 years ago.  He actually had transition to gynecology for several years prior to full retirement.  He has history of CAD, hypertension, hyperlipidemia,'s restless legs.  Recently has had some restless leg symptoms even with taking 0.5 Mirapex.  Generally doing fairly well.  He did not take his blood pressure medications this morning.  He states at home his blood pressures have generally been below [id number] systolic and below 90 diastolic.  He also has not taken his Lipitor past few weeks because of some muscle cramps.  He plans to start this back soon.  Health maintenance reviewed:  -Pneumonia vaccines complete -Tetanus boosted 8/21 -Has had COVID vaccines with boosters -No history of shingles vaccine -Aged out of further colonoscopies.  Family history and social history reviewed and as below.   Past Medical History:  Diagnosis Date   Kidney stones    Meniere's disease    Palpitations     Past Surgical History:  Procedure Laterality Date   ANGIOPLASTY     3 stents    INGUINAL HERNIA REPAIR     LAMINECTOMY     lumbar x 2    Family History  Problem Relation Age of Onset   Other Mother 8       subdural hematoma- deceased   Stroke Father 67       deceased    Social History   Socioeconomic History   Marital status: Married    Spouse name: Not on file   Number of children: Not on file   Years of education: Not on file   Highest education level: Not on file  Occupational History   Not on file  Tobacco Use   Smoking status: Former    Types: Cigarettes    Quit date: 07/31/1965    Years since quitting: 55.6   Smokeless tobacco: Never  Vaping Use   Vaping  Use: Never used  Substance and Sexual Activity   Alcohol use: No   Drug use: No   Sexual activity: Not on file  Other Topics Concern   Not on file  Social History Narrative   Not on file   Social Determinants of Health   Financial Resource Strain: Low Risk    Difficulty of Paying Living Expenses: Not hard at all  Food Insecurity: No Food Insecurity   Worried About Charity fundraiser in the Last Year: Never true   [place] in the Last Year: Never true  Transportation Needs: No Transportation Needs   Lack of Transportation (Medical): No   Lack of Transportation (Non-Medical): No  Physical Activity: Inactive   Days of Exercise per Week: 0 days   Minutes of Exercise per Session: 0 min  Stress: No Stress Concern Present   Feeling of Stress : Not at all  Social Connections: Socially Integrated   Frequency of Communication with Friends and Family: More than three times a week   Frequency of Social Gatherings with Friends and Family: More than three times a week   Attends Religious Services: 1 to 4 times per year   Active Member of [employer] or Organizations: Yes  Attends Archivist Meetings: Never   Marital Status: Married  Human resources officer Violence: Not At Risk   Fear of Current or Ex-Partner: No   Emotionally Abused: No   Physically Abused: No   Sexually Abused: No    Outpatient Medications Prior to Visit  Medication Sig Dispense Refill   aspirin 325 MG tablet Take 325 mg by mouth daily.     atorvastatin (LIPITOR) 40 MG tablet Take 1 tablet (40 mg total) by mouth daily. 30 tablet 3   clopidogrel (PLAVIX) 75 MG tablet Take 75 mg by mouth daily.     fexofenadine (ALLEGRA) 60 MG tablet Take 60 mg by mouth as needed for allergies or rhinitis.     losartan (COZAAR) 100 MG tablet Take 1 tablet (100 mg total) by mouth daily. 90 tablet 3   metoprolol succinate (TOPROL-XL) 25 MG 24 hr tablet Take 25 mg by mouth daily.     Naproxen Sodium 220 MG CAPS Take by mouth as  needed.     nitroGLYCERIN (NITROSTAT) 0.4 MG SL tablet Place 0.4 mg under the tongue every 5 (five) minutes as needed.     pramipexole (MIRAPEX) 0.25 MG tablet Take one to two po qhs prn restless leg symptoms 180 tablet 3   tamsulosin (FLOMAX) 0.4 MG CAPS capsule      No facility-administered medications prior to visit.    No Known Allergies  ROS Review of Systems  Constitutional:  Negative for activity change, appetite change, fatigue and fever.  HENT:  Negative for congestion, ear pain and trouble swallowing.   Eyes:  Negative for pain and visual disturbance.  Respiratory:  Negative for cough, shortness of breath and wheezing.   Cardiovascular:  Negative for chest pain and palpitations.  Gastrointestinal:  Negative for abdominal distention, abdominal pain, blood in stool, constipation, diarrhea, nausea, rectal pain and vomiting.  Endocrine: Negative for polydipsia and polyuria.  Genitourinary:  Negative for dysuria, hematuria and testicular pain.  Musculoskeletal:  Negative for arthralgias and joint swelling.  Skin:  Negative for rash.  Neurological:  Negative for dizziness, syncope and headaches.  Hematological:  Negative for adenopathy.  Psychiatric/Behavioral:  Negative for confusion and dysphoric mood.      Objective:    Physical Exam Constitutional:      Appearance: He is well-developed.  HENT:     Right Ear: External ear normal.     Left Ear: External ear normal.  Eyes:     Pupils: Pupils are equal, round, and reactive to light.  Neck:     Thyroid: No thyromegaly.  Cardiovascular:     Rate and Rhythm: Normal rate and regular rhythm.  Pulmonary:     Effort: Pulmonary effort is normal. No respiratory distress.     Breath sounds: Normal breath sounds. No wheezing or rales.  Musculoskeletal:     Cervical back: Neck supple.  Skin:    Comments: He has a few scaly areas on his face with history of actinic keratosis.  He is followed closely by dermatology   Neurological:     Mental Status: He is alert and oriented to person, place, and time.    BP (!) 160/80 (BP Location: Left Arm, Cuff Size: Normal)   Pulse 65   Temp 97.9 F (36.6 C) (Oral)   Ht '5\' 9"'$  (1.753 m)   Wt 191 lb 4.8 oz (86.8 kg)   SpO2 99%   BMI 28.25 kg/m  Wt Readings from Last 3 Encounters:  03/09/21 191 lb 4.8 oz (  86.8 kg)  03/08/20 189 lb 11.2 oz (86 kg)  03/04/19 191 lb 14.4 oz (87 kg)     Health Maintenance Due  Topic Date Due   Zoster Vaccines- Shingrix (1 of 2) Never done   COVID-19 Vaccine (3 - Pfizer risk series) 09/30/2019   INFLUENZA VACCINE  02/28/2021    There are no preventive care reminders to display for this patient.  Lab Results  Component Value Date   TSH 1.73 03/08/2020   Lab Results  Component Value Date   WBC 6.1 03/08/2020   HGB 15.1 03/08/2020   HCT 43.9 03/08/2020   MCV 94.4 03/08/2020   PLT 181 03/08/2020   Lab Results  Component Value Date   NA 141 03/08/2020   K 4.5 03/08/2020   CO2 22 03/08/2020   GLUCOSE 98 03/08/2020   BUN 26 (H) 03/08/2020   CREATININE 1.33 (H) 03/08/2020   BILITOT 0.9 03/08/2020   ALKPHOS 85 03/04/2019   AST 15 03/08/2020   ALT 17 03/08/2020   PROT 6.2 03/08/2020   ALBUMIN 4.1 03/04/2019   CALCIUM 9.0 03/08/2020   GFR 47.79 (L) 03/04/2019   Lab Results  Component Value Date   CHOL 214 (H) 03/08/2020   Lab Results  Component Value Date   HDL 42 03/08/2020   Lab Results  Component Value Date   LDLCALC 133 (H) 03/08/2020   Lab Results  Component Value Date   TRIG 243 (H) 03/08/2020   Lab Results  Component Value Date   CHOLHDL 5.1 (H) 03/08/2020   No results found for: HGBA1C    Assessment & Plan:   Problem List Items Addressed This Visit   None Visit Diagnoses     Physical exam    -  Primary   Relevant Orders   Basic metabolic panel   Lipid panel   CBC with Differential/Platelet   TSH   Hepatic function panel     We discussed several health maintenance issues as  follows  -Recommend annual flu vaccine -We did suggest he consider Shingrix and he will consider getting this at pharmacy -Obtain screening labs as above -Refill all regular medications for 1 year  No orders of the defined types were placed in this encounter.   Follow-up: No follow-ups on file.    Carolann Littler, MD

## 2021-05-25 ENCOUNTER — Telehealth: Payer: Self-pay

## 2021-05-25 MED ORDER — PRAMIPEXOLE DIHYDROCHLORIDE 0.25 MG PO TABS: ORAL_TABLET | ORAL | 3 refills | Status: DC

## 2021-05-25 NOTE — Addendum Note (Signed)
Addended by: Rebecca Eaton on: 05/25/2021 01:16 PM   Modules accepted: Orders

## 2021-05-25 NOTE — Addendum Note (Signed)
Addended by: Rebecca Eaton on: 05/25/2021 01:39 PM   Modules accepted: Orders

## 2021-05-25 NOTE — Telephone Encounter (Signed)
Patient called requesting Rx refill pt stated their was a dose change and has ran out and would like a 90 day supply sent to the pharmacy  pramipexole (MIRAPEX) 0.25 MG tablet

## 2021-05-25 NOTE — Telephone Encounter (Signed)
Rx sent in.   I called the patient to let him know the Rx has been sent in and he informed me that he has been taking 3-4 tablets instead of the 1-2 tablets per Dr. Erick Blinks instructions at his last visit. Due to this his insurance will not allow him to get a refill and he is out of medication. He would like to know if we can change the directions or the dose of the medication.  Please advise.

## 2021-05-25 NOTE — Telephone Encounter (Addendum)
Rx has been sent in. Patient has been made aware.

## 2021-06-01 ENCOUNTER — Telehealth: Payer: Medicare HMO | Admitting: Adult Health

## 2021-07-06 ENCOUNTER — Telehealth: Payer: Self-pay

## 2021-07-06 DIAGNOSIS — R319 Hematuria, unspecified: Secondary | ICD-10-CM

## 2021-07-06 NOTE — Telephone Encounter (Signed)
I spoke with the pt and he reported that he is experiencing renal stones, x2 days. Pt reported no pain but stated he has hematuria. Pt stated he did a UA dipstick yesterday and blood was in his urine. Pt request referral to Urology. Please advise

## 2021-07-06 NOTE — Telephone Encounter (Signed)
Referral placed and pt is aware. 

## 2021-07-06 NOTE — Addendum Note (Signed)
Addended by: Nilda Riggs on: 07/06/2021 01:43 PM   Modules accepted: Orders

## 2021-07-06 NOTE — Telephone Encounter (Signed)
Patient called asking for a call back patient would not give any details, just that it's personal

## 2021-07-11 ENCOUNTER — Ambulatory Visit (INDEPENDENT_AMBULATORY_CARE_PROVIDER_SITE_OTHER): Payer: Medicare HMO

## 2021-07-11 VITALS — Ht 71.0 in | Wt 190.0 lb

## 2021-07-11 DIAGNOSIS — L821 Other seborrheic keratosis: Secondary | ICD-10-CM | POA: Diagnosis not present

## 2021-07-11 DIAGNOSIS — L570 Actinic keratosis: Secondary | ICD-10-CM | POA: Diagnosis not present

## 2021-07-11 DIAGNOSIS — Z0000 Encounter for general adult medical examination without abnormal findings: Secondary | ICD-10-CM | POA: Diagnosis not present

## 2021-07-11 DIAGNOSIS — Z85828 Personal history of other malignant neoplasm of skin: Secondary | ICD-10-CM | POA: Diagnosis not present

## 2021-07-11 DIAGNOSIS — L578 Other skin changes due to chronic exposure to nonionizing radiation: Secondary | ICD-10-CM | POA: Diagnosis not present

## 2021-07-11 DIAGNOSIS — D485 Neoplasm of uncertain behavior of skin: Secondary | ICD-10-CM | POA: Diagnosis not present

## 2021-07-11 DIAGNOSIS — L814 Other melanin hyperpigmentation: Secondary | ICD-10-CM | POA: Diagnosis not present

## 2021-07-11 NOTE — Progress Notes (Signed)
I connected with Claris Gower, MD today by telephone and verified that I am speaking with the correct person using two identifiers. Location patient: home Location provider: work Persons participating in the virtual visit: Claris Gower, MD, Glenna Durand LPN.   I discussed the limitations, risks, security and privacy concerns of performing an evaluation and management service by telephone and the availability of in person appointments. I also discussed with the patient that there may be a patient responsible charge related to this service. The patient expressed understanding and verbally consented to this telephonic visit.    Interactive audio and video telecommunications were attempted between this provider and patient, however failed, due to patient having technical difficulties OR patient did not have access to video capability.  We continued and completed visit with audio only.     Vital signs may be patient reported or missing.  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  Review of Systems     Cardiac Risk Factors include: advanced age (>38men, >77 women);dyslipidemia;hypertension;male gender     Objective:    Today's Vitals   07/11/21 1311  Weight: 190 lb (86.2 kg)  Height: 5\' 11"  (1.803 m)   Body mass index is 26.5 kg/m.  Advanced Directives 07/11/2021 07/13/2020  Does Patient Have a Medical Advance Directive? Yes Yes  Type of Paramedic of [place];Living will [place];Living will  Does patient want to make changes to medical advance directive? - No - Patient declined  Copy of [place] in Chart? No - copy requested No - copy requested    Current Medications (verified) Outpatient Encounter Medications as of 07/11/2021  Medication Sig   aspirin 325 MG tablet Take 325 mg by mouth daily.   atorvastatin (LIPITOR) 40 MG tablet  Take 1 tablet (40 mg total) by mouth daily.   clopidogrel (PLAVIX) 75 MG tablet Take 1 tablet (75 mg total) by mouth daily.   fexofenadine (ALLEGRA) 60 MG tablet Take 60 mg by mouth as needed for allergies or rhinitis.   losartan (COZAAR) 100 MG tablet Take 1 tablet (100 mg total) by mouth daily.   metoprolol succinate (TOPROL-XL) 25 MG 24 hr tablet Take 1 tablet (25 mg total) by mouth daily.   Naproxen Sodium 220 MG CAPS Take by mouth as needed.   nitroGLYCERIN (NITROSTAT) 0.4 MG SL tablet Place 0.4 mg under the tongue every 5 (five) minutes as needed.   pramipexole (MIRAPEX) 0.25 MG tablet Take three to four po qhs prn restless leg symptoms   tamsulosin (FLOMAX) 0.4 MG CAPS capsule Take 1 capsule (0.4 mg total) by mouth daily after supper.   No facility-administered encounter medications on file as of 07/11/2021.    Allergies (verified) Patient has no known allergies.   History: Past Medical History:  Diagnosis Date   Kidney stones    Meniere's disease    Palpitations    Past Surgical History:  Procedure Laterality Date   ANGIOPLASTY     3 stents    INGUINAL HERNIA REPAIR     LAMINECTOMY     lumbar x 2   Family History  Problem Relation Age of Onset   Other Mother 74       subdural hematoma- deceased   Stroke Father 47       deceased   Social History   Socioeconomic History   Marital status: Married    Spouse name: Not  on file   Number of children: Not on file   Years of education: Not on file   Highest education level: Not on file  Occupational History   Not on file  Tobacco Use   Smoking status: Former    Types: Cigarettes    Quit date: 07/31/1965    Years since quitting: 55.9   Smokeless tobacco: Never  Vaping Use   Vaping Use: Never used  Substance and Sexual Activity   Alcohol use: Yes    Comment: moderately   Drug use: No   Sexual activity: Not on file  Other Topics Concern   Not on file  Social History Narrative   Not on file   Social  Determinants of Health   Financial Resource Strain: Low Risk    Difficulty of Paying Living Expenses: Not hard at all  Food Insecurity: No Food Insecurity   Worried About Charity fundraiser in the Last Year: Never true   [place] in the Last Year: Never true  Transportation Needs: No Transportation Needs   Lack of Transportation (Medical): No   Lack of Transportation (Non-Medical): No  Physical Activity: Inactive   Days of Exercise per Week: 0 days   Minutes of Exercise per Session: 0 min  Stress: No Stress Concern Present   Feeling of Stress : Not at all  Social Connections: Socially Integrated   Frequency of Communication with Friends and Family: More than three times a week   Frequency of Social Gatherings with Friends and Family: More than three times a week   Attends Religious Services: 1 to 4 times per year   Active Member of [employer] or Organizations: Yes   Attends Archivist Meetings: Never   Marital Status: Married    Tobacco Counseling Counseling given: Not Answered   Clinical Intake:  Pre-visit preparation completed: Yes  Pain : No/denies pain     Nutritional Risks: None Diabetes: No  How often do you need to have someone help you when you read instructions, pamphlets, or other written materials from your doctor or pharmacy?: 1 - Never What is the last grade level you completed in school?: PhD  Diabetic? no  Interpreter Needed?: No  Information entered by :: NAllen LPN   Activities of Daily Living In your present state of health, do you have any difficulty performing the following activities: 07/11/2021 07/13/2020  Hearing? N N  Vision? Y N  Comment difficult reading paper -  Difficulty concentrating or making decisions? N N  Walking or climbing stairs? N N  Dressing or bathing? N N  Doing errands, shopping? N N  Preparing Food and eating ? N N  Using the Toilet? N N  In the past six months, have you accidently leaked urine? N N   Do you have problems with loss of bowel control? N N  Managing your Medications? N N  Managing your Finances? N N  Housekeeping or managing your Housekeeping? N N  Some recent data might be hidden    Patient Care Team: [name], MD as PCP - General (Family Medicine) Viona Gilmore, [facility] (Pharmacist) Viona Gilmore, [facility] as Pharmacist (Pharmacist)  Indicate any recent Medical Services you may have received from other than [facility] providers in the past year (date may be approximate).     Assessment:   This is a routine wellness examination for [place].  Hearing/Vision screen Vision Screening - Comments:: Regular eye exams, [place]  issues and exercise activities discussed: Current Exercise Habits: The patient does not participate in regular exercise at present   Goals Addressed             This Visit's Progress    Patient Stated       07/11/2021, wants to see why having hematuria       Depression Screen PHQ 2/9 Scores 07/11/2021 07/13/2020 03/08/2020 03/04/2019  PHQ - 2 Score 0 0 0 0  PHQ- 9 Score - 0 - 0    Fall Risk Fall Risk  07/11/2021 07/13/2020 03/08/2020 03/04/2019  Falls in the past year? 0 1 0 1  Number falls in past yr: - 0 0 0  Injury with Fall? - 0 0 0  Risk for fall due to : Medication side effect History of fall(s) - -  Follow up Falls evaluation completed;Education provided;Falls prevention discussed Falls evaluation completed;Falls prevention discussed - -    FALL RISK PREVENTION PERTAINING TO THE HOME:  Any stairs in or around the home? Yes  If so, are there any without handrails? No  Home free of loose throw rugs in walkways, pet beds, electrical cords, etc? Yes  Adequate lighting in your home to reduce risk of falls? Yes   ASSISTIVE DEVICES UTILIZED TO PREVENT FALLS:  Life alert? No  Use of a cane, walker or w/c? No  Grab bars in the bathroom? Yes  Shower chair or bench in shower? Yes  Elevated toilet seat or a  handicapped toilet? Yes   TIMED UP AND GO:  Was the test performed? No .      Cognitive Function:        Immunizations Immunization History  Administered Date(s) Administered   Influenza-Unspecified 07/31/2016   PFIZER(Purple Top)SARS-COV-2 Vaccination 08/08/2019, 09/02/2019   Pneumococcal Conjugate-13 07/31/2016   Pneumococcal Polysaccharide-23 03/15/2010   Tdap 03/08/2020    TDAP status: Up to date  Flu Vaccine status: Up to date  Pneumococcal vaccine status: Up to date  Covid-19 vaccine status: Completed vaccines  Qualifies for Shingles Vaccine? Yes   Zostavax completed No   Shingrix Completed?: No.    Education has been provided regarding the importance of this vaccine. Patient has been advised to call insurance company to determine out of pocket expense if they have not yet received this vaccine. Advised may also receive vaccine at local pharmacy or Health Dept. Verbalized acceptance and understanding.  Screening Tests Health Maintenance  Topic Date Due   Zoster Vaccines- Shingrix (1 of 2) Never done   COVID-19 Vaccine (3 - Pfizer risk series) 09/30/2019   INFLUENZA VACCINE  02/28/2021   TETANUS/TDAP  03/08/2030   Pneumonia Vaccine 72+ Years old  Completed   HPV VACCINES  Aged Out    Health Maintenance  Health Maintenance Due  Topic Date Due   Zoster Vaccines- Shingrix (1 of 2) Never done   COVID-19 Vaccine (3 - Pfizer risk series) 09/30/2019   INFLUENZA VACCINE  02/28/2021    Colorectal cancer screening: No longer required.   Lung Cancer Screening: (Low Dose CT Chest recommended if Age 48-80 years, 30 pack-year currently smoking OR have quit w/in 15years.) does not qualify.   Lung Cancer Screening Referral: no  Additional Screening:  Hepatitis C Screening: does not qualify;   Vision Screening: Recommended annual ophthalmology exams for early detection of glaucoma and other disorders of the eye. Is the patient up to date with their annual eye  exam?  Yes  Who is the provider or what is  the name of the office in which the patient attends annual eye exams? [facility] If pt is not established with a provider, would they like to be referred to a provider to establish care? No .   Dental Screening: Recommended annual dental exams for proper oral hygiene  Community Resource Referral / Chronic Care Management: CRR required this visit?  No   CCM required this visit?  No      Plan:     I have personally reviewed and noted the following in the patient's chart:   Medical and social history Use of alcohol, tobacco or illicit drugs  Current medications and supplements including opioid prescriptions. Patient is not currently taking opioid prescriptions. Functional ability and status Nutritional status Physical activity Advanced directives List of other physicians Hospitalizations, surgeries, and ER visits in previous 12 months Vitals Screenings to include cognitive, depression, and falls Referrals and appointments  In addition, I have reviewed and discussed with patient certain preventive protocols, quality metrics, and best practice recommendations. A written personalized care plan for preventive services as well as general preventive health recommendations were provided to patient.     Kellie Simmering, LPN   [id number]   Nurse Notes: 6 CIT not administered. Patient appeared cognitive per conversation.

## 2021-07-11 NOTE — Patient Instructions (Signed)
Ronald Case , Thank you for taking time to come for your Medicare Wellness Visit. I appreciate your ongoing commitment to your health goals. Please review the following plan we discussed and let me know if I can assist you in the future.   Screening recommendations/referrals: Colonoscopy: not required Recommended yearly ophthalmology/optometry visit for glaucoma screening and checkup Recommended yearly dental visit for hygiene and checkup  Vaccinations: Influenza vaccine: completed 05/12/2021 Pneumococcal vaccine: completed 07/31/2016 Tdap vaccine: completed 03/08/2020, due 03/08/2030 Shingles vaccine: discussed   Covid-19:  05/12/2021, 12/09/2020, 07/30/2020, 09/02/2019, 08/08/2019  Advanced directives: Please bring a copy of your POA (Power of Attorney) and/or Living Will to your next appointment.   Conditions/risks identified: none  Next appointment: Follow up in one year for your annual wellness visit.   Preventive Care 85 Years and Older, Male Preventive care refers to lifestyle choices and visits with your health care provider that can promote health and wellness. What does preventive care include? A yearly physical exam. This is also called an annual well check. Dental exams once or twice a year. Routine eye exams. Ask your health care provider how often you should have your eyes checked. Personal lifestyle choices, including: Daily care of your teeth and gums. Regular physical activity. Eating a healthy diet. Avoiding tobacco and drug use. Limiting alcohol use. Practicing safe sex. Taking low doses of aspirin every day. Taking vitamin and mineral supplements as recommended by your health care provider. What happens during an annual well check? The services and screenings done by your health care provider during your annual well check will depend on your age, overall health, lifestyle risk factors, and family history of disease. Counseling  Your health care provider may ask you  questions about your: Alcohol use. Tobacco use. Drug use. Emotional well-being. Home and relationship well-being. Sexual activity. Eating habits. History of falls. Memory and ability to understand (cognition). Work and work Statistician. Screening  You may have the following tests or measurements: Height, weight, and BMI. Blood pressure. Lipid and cholesterol levels. These may be checked every 5 years, or more frequently if you are over 72 years old. Skin check. Lung cancer screening. You may have this screening every year starting at age 71 if you have a 30-pack-year history of smoking and currently smoke or have quit within the past 15 years. Fecal occult blood test (FOBT) of the stool. You may have this test every year starting at age 77. Flexible sigmoidoscopy or colonoscopy. You may have a sigmoidoscopy every 5 years or a colonoscopy every 10 years starting at age 29. Prostate cancer screening. Recommendations will vary depending on your family history and other risks. Hepatitis C blood test. Hepatitis B blood test. Sexually transmitted disease (STD) testing. Diabetes screening. This is done by checking your blood sugar (glucose) after you have not eaten for a while (fasting). You may have this done every 1-3 years. Abdominal aortic aneurysm (AAA) screening. You may need this if you are a current or former smoker. Osteoporosis. You may be screened starting at age 50 if you are at high risk. Talk with your health care provider about your test results, treatment options, and if necessary, the need for more tests. Vaccines  Your health care provider may recommend certain vaccines, such as: Influenza vaccine. This is recommended every year. Tetanus, diphtheria, and acellular pertussis (Tdap, Td) vaccine. You may need a Td booster every 10 years. Zoster vaccine. You may need this after age 57. Pneumococcal 13-valent conjugate (PCV13) vaccine. One dose  is recommended after age  65. Pneumococcal polysaccharide (PPSV23) vaccine. One dose is recommended after age 7. Talk to your health care provider about which screenings and vaccines you need and how often you need them. This information is not intended to replace advice given to you by your health care provider. Make sure you discuss any questions you have with your health care provider. Document Released: 08/13/2015 Document Revised: 04/05/2016 Document Reviewed: 05/18/2015 Elsevier Interactive Patient Education  2017 [place] Prevention in the Home Falls can cause injuries. They can happen to people of all ages. There are many things you can do to make your home safe and to help prevent falls. What can I do on the outside of my home? Regularly fix the edges of walkways and driveways and fix any cracks. Remove anything that might make you trip as you walk through a door, such as a raised step or threshold. Trim any bushes or trees on the path to your home. Use bright outdoor lighting. Clear any walking paths of anything that might make someone trip, such as rocks or tools. Regularly check to see if handrails are loose or broken. Make sure that both sides of any steps have handrails. Any raised decks and porches should have guardrails on the edges. Have any leaves, snow, or ice cleared regularly. Use sand or salt on walking paths during winter. Clean up any spills in your garage right away. This includes oil or grease spills. What can I do in the bathroom? Use night lights. Install grab bars by the toilet and in the tub and shower. Do not use towel bars as grab bars. Use non-skid mats or decals in the tub or shower. If you need to sit down in the shower, use a plastic, non-slip stool. Keep the floor dry. Clean up any water that spills on the floor as soon as it happens. Remove soap buildup in the tub or shower regularly. Attach bath mats securely with double-sided non-slip rug tape. Do not have throw  rugs and other things on the floor that can make you trip. What can I do in the bedroom? Use night lights. Make sure that you have a light by your bed that is easy to reach. Do not use any sheets or blankets that are too big for your bed. They should not hang down onto the floor. Have a firm chair that has side arms. You can use this for support while you get dressed. Do not have throw rugs and other things on the floor that can make you trip. What can I do in the kitchen? Clean up any spills right away. Avoid walking on wet floors. Keep items that you use a lot in easy-to-reach places. If you need to reach something above you, use a strong step stool that has a grab bar. Keep electrical cords out of the way. Do not use floor polish or wax that makes floors slippery. If you must use wax, use non-skid floor wax. Do not have throw rugs and other things on the floor that can make you trip. What can I do with my stairs? Do not leave any items on the stairs. Make sure that there are handrails on both sides of the stairs and use them. Fix handrails that are broken or loose. Make sure that handrails are as long as the stairways. Check any carpeting to make sure that it is firmly attached to the stairs. Fix any carpet that is loose or worn. Avoid  having throw rugs at the top or bottom of the stairs. If you do have throw rugs, attach them to the floor with carpet tape. Make sure that you have a light switch at the top of the stairs and the bottom of the stairs. If you do not have them, ask someone to add them for you. What else can I do to help prevent falls? Wear shoes that: Do not have high heels. Have rubber bottoms. Are comfortable and fit you well. Are closed at the toe. Do not wear sandals. If you use a stepladder: Make sure that it is fully opened. Do not climb a closed stepladder. Make sure that both sides of the stepladder are locked into place. Ask someone to hold it for you, if  possible. Clearly mark and make sure that you can see: Any grab bars or handrails. First and last steps. Where the edge of each step is. Use tools that help you move around (mobility aids) if they are needed. These include: Canes. Walkers. Scooters. Crutches. Turn on the lights when you go into a dark area. Replace any light bulbs as soon as they burn out. Set up your furniture so you have a clear path. Avoid moving your furniture around. If any of your floors are uneven, fix them. If there are any pets around you, be aware of where they are. Review your medicines with your doctor. Some medicines can make you feel dizzy. This can increase your chance of falling. Ask your doctor what other things that you can do to help prevent falls. This information is not intended to replace advice given to you by your health care provider. Make sure you discuss any questions you have with your health care provider. Document Released: 05/13/2009 Document Revised: 12/23/2015 Document Reviewed: 08/21/2014 Elsevier Interactive Patient Education  2017 [employer].

## 2021-07-28 DIAGNOSIS — R310 Gross hematuria: Secondary | ICD-10-CM | POA: Diagnosis not present

## 2021-09-01 DIAGNOSIS — R310 Gross hematuria: Secondary | ICD-10-CM | POA: Diagnosis not present

## 2021-09-01 DIAGNOSIS — K5730 Diverticulosis of large intestine without perforation or abscess without bleeding: Secondary | ICD-10-CM | POA: Diagnosis not present

## 2021-09-01 DIAGNOSIS — N400 Enlarged prostate without lower urinary tract symptoms: Secondary | ICD-10-CM | POA: Diagnosis not present

## 2021-09-01 DIAGNOSIS — N281 Cyst of kidney, acquired: Secondary | ICD-10-CM | POA: Diagnosis not present

## 2021-09-01 DIAGNOSIS — N200 Calculus of kidney: Secondary | ICD-10-CM | POA: Diagnosis not present

## 2021-09-01 DIAGNOSIS — I700 Atherosclerosis of aorta: Secondary | ICD-10-CM | POA: Diagnosis not present

## 2021-09-05 DIAGNOSIS — R310 Gross hematuria: Secondary | ICD-10-CM | POA: Diagnosis not present

## 2021-09-05 DIAGNOSIS — N200 Calculus of kidney: Secondary | ICD-10-CM | POA: Diagnosis not present

## 2021-09-07 ENCOUNTER — Other Ambulatory Visit: Payer: Self-pay | Admitting: Urology

## 2021-09-09 NOTE — Patient Instructions (Signed)
DUE TO COVID-19 ONLY ONE VISITOR IS ALLOWED TO COME WITH YOU AND STAY IN THE WAITING ROOM ONLY DURING PRE OP AND PROCEDURE DAY OF SURGERY IF YOU ARE GOING HOME AFTER SURGERY. IF YOU ARE SPENDING THE NIGHT 2 PEOPLE MAY VISIT WITH YOU IN YOUR PRIVATE ROOM AFTER SURGERY UNTIL VISITING  HOURS ARE OVER AT 800 PM AND 1  VISITOR  MAY  SPEND THE NIGHT.                 Worthy Flank, MD     Your procedure is scheduled on: 09/13/21   Report to [facility] Main  Entrance   Report to admitting at   10:15 AM     Call this number if you have problems the morning of surgery [phone]    Remember: Do not eat food or drink :After Midnight the night before your surgery,        BRUSH YOUR TEETH MORNING OF SURGERY AND RINSE YOUR MOUTH OUT, NO CHEWING GUM CANDY OR MINTS.     Take these medicines the morning of surgery with A SIP OF WATER: Metoprolol, Tamsulosin                                You may not have any metal on your body including              piercings  Do not wear jewelry, lotions, powders or deodorant.              Men may shave face and neck.   Do not bring valuables to the hospital. [place].  Contacts, dentures or bridgework may not be worn into surgery.     Patients discharged the day of surgery will not be allowed to drive home.  IF YOU ARE HAVING SURGERY AND GOING HOME THE SAME DAY, YOU MUST HAVE AN ADULT TO DRIVE YOU HOME AND BE WITH YOU FOR 24 HOURS. YOU MAY GO HOME BY TAXI OR UBER OR ORTHERWISE, BUT AN ADULT MUST ACCOMPANY YOU HOME AND STAY WITH YOU FOR 24 HOURS.  Name and phone number of your driver:  Special Instructions: N/A              Please read over the following fact sheets you were given: _____________________________________________________________________             [facility] - Preparing for Surgery Before surgery, you can play an important role.  Because skin is not sterile, your skin  needs to be as free of germs as possible.  You can reduce the number of germs on your skin by washing with CHG (chlorahexidine gluconate) soap before surgery.  CHG is an antiseptic cleaner which kills germs and bonds with the skin to continue killing germs even after washing. Please DO NOT use if you have an allergy to CHG or antibacterial soaps.  If your skin becomes reddened/irritated stop using the CHG and inform your nurse when you arrive at Short Stay.  You may shave your face/neck. Please follow these instructions carefully:  1.  Shower with CHG Soap the night before surgery and the  morning of Surgery.  2.  If you choose to wash your hair, wash your hair first as usual with your  normal  shampoo.  3.  After you shampoo,  rinse your hair and body thoroughly to remove the  shampoo.                            4.  Use CHG as you would any other liquid soap.  You can apply chg directly  to the skin and wash                       Gently with a scrungie or clean washcloth.  5.  Apply the CHG Soap to your body ONLY FROM THE NECK DOWN.   Do not use on face/ open                           Wound or open sores. Avoid contact with eyes, ears mouth and genitals (private parts).                       Wash face,  Genitals (private parts) with your normal soap.             6.  Wash thoroughly, paying special attention to the area where your surgery  will be performed.  7.  Thoroughly rinse your body with warm water from the neck down.  8.  DO NOT shower/wash with your normal soap after using and rinsing off  the CHG Soap.             9.  Pat yourself dry with a clean towel.            10.  Wear clean pajamas.            11.  Place clean sheets on your bed the night of your first shower and do not  sleep with pets. Day of Surgery : Do not apply any lotions/deodorants the morning of surgery.  Please wear clean clothes to the hospital/surgery center.  FAILURE TO FOLLOW THESE INSTRUCTIONS MAY RESULT IN THE  CANCELLATION OF YOUR SURGERY   ________________________________________________________________________

## 2021-09-12 ENCOUNTER — Encounter (HOSPITAL_COMMUNITY)
Admission: RE | Admit: 2021-09-12 | Discharge: 2021-09-12 | Disposition: A | Discharge status: Home / Self Care | Payer: Medicare HMO | Source: Ambulatory Visit | Attending: Urology | Admitting: Urology

## 2021-09-12 ENCOUNTER — Encounter (HOSPITAL_COMMUNITY): Payer: Self-pay | Admitting: Urology

## 2021-09-12 ENCOUNTER — Encounter (HOSPITAL_COMMUNITY): Payer: Self-pay

## 2021-09-12 ENCOUNTER — Other Ambulatory Visit: Payer: Self-pay

## 2021-09-12 VITALS — BP 160/82 | HR 68 | Temp 98.4°F | Resp 20 | Ht 71.0 in | Wt 195.0 lb

## 2021-09-12 DIAGNOSIS — Z01818 Encounter for other preprocedural examination: Secondary | ICD-10-CM | POA: Diagnosis not present

## 2021-09-12 DIAGNOSIS — I10 Essential (primary) hypertension: Secondary | ICD-10-CM | POA: Diagnosis not present

## 2021-09-12 HISTORY — DX: Cardiac arrhythmia, unspecified: I49.9

## 2021-09-12 HISTORY — DX: Essential (primary) hypertension: I10

## 2021-09-12 HISTORY — DX: Unspecified osteoarthritis, unspecified site: M19.90

## 2021-09-12 HISTORY — DX: Atherosclerotic heart disease of native coronary artery without angina pectoris: I25.10

## 2021-09-12 HISTORY — DX: Personal history of urinary calculi: Z87.442

## 2021-09-12 LAB — BASIC METABOLIC PANEL
Anion gap: 6 (ref 5–15)
BUN: 22 mg/dL (ref 8–23)
CO2: 22 mmol/L (ref 22–32)
Calcium: 8.7 mg/dL — ABNORMAL LOW (ref 8.9–10.3)
Chloride: 108 mmol/L (ref 98–111)
Creatinine, Ser: 1.37 mg/dL — ABNORMAL HIGH (ref 0.61–1.24)
GFR, Estimated: 50 mL/min — ABNORMAL LOW (ref >60)
Glucose, Bld: 106 mg/dL — ABNORMAL HIGH (ref 70–99)
Potassium: 3.8 mmol/L (ref 3.5–5.1)
Sodium: 136 mmol/L (ref 135–145)

## 2021-09-12 LAB — CBC
HCT: 44.4 % (ref 39.0–52.0)
Hemoglobin: 15.3 g/dL (ref 13.0–17.0)
MCH: 31.9 pg (ref 26.0–34.0)
MCHC: 34.5 g/dL (ref 30.0–36.0)
MCV: 92.7 fL (ref 80.0–100.0)
Platelets: 217 10*3/uL (ref 150–400)
RBC: 4.79 MIL/uL (ref 4.22–5.81)
RDW: 13.7 % (ref 11.5–15.5)
WBC: 6.4 10*3/uL (ref 4.0–10.5)
nRBC: 0 % (ref 0.0–0.2)

## 2021-09-12 NOTE — Progress Notes (Signed)
COVID test- NA  Bowel prep reminder:NA  PCP - Dr. Elease Hashimoto Cardiologist - Dr. Burt Knack  Chest x-ray - no EKG - 09/12/21-chart Stress Test - no ECHO - 2011-epic  Cardiac Cath - 2006,2012 3 stents each Pacemaker/ICD device last checked:NA  Sleep Study - no CPAP -   Fasting Blood Sugar - NA Checks Blood Sugar _____ times a day  Blood Thinner Instructions:Plavix and ASA 81/ Dr. Burt Knack Aspirin Instructions:hold for 5 days prior to DOS/ Dr Cain Sieve Last Dose:09/08/21  Anesthesia review: yes  Patient denies shortness of breath, fever, cough and chest pain at PAT appointment Pt has no SOB with any activities.   Patient verbalized understanding of instructions that were given to them at the PAT appointment. Patient was also instructed that they will need to review over the PAT instructions again at home before surgery. yes

## 2021-09-13 ENCOUNTER — Encounter (HOSPITAL_COMMUNITY)
Admission: RE | Disposition: A | Discharge status: Home / Self Care | Payer: Self-pay | Source: Home / Self Care | Attending: Urology

## 2021-09-13 ENCOUNTER — Encounter (HOSPITAL_COMMUNITY): Payer: Self-pay | Admitting: Urology

## 2021-09-13 ENCOUNTER — Ambulatory Visit (HOSPITAL_COMMUNITY): Payer: Medicare HMO | Admitting: Anesthesiology

## 2021-09-13 ENCOUNTER — Ambulatory Visit (HOSPITAL_COMMUNITY): Payer: Medicare HMO

## 2021-09-13 ENCOUNTER — Ambulatory Visit (HOSPITAL_COMMUNITY)
Admission: RE | Admit: 2021-09-13 | Discharge: 2021-09-13 | Disposition: A | Discharge status: Home / Self Care | Payer: Medicare HMO | Attending: Urology | Admitting: Urology

## 2021-09-13 ENCOUNTER — Ambulatory Visit (HOSPITAL_BASED_OUTPATIENT_CLINIC_OR_DEPARTMENT_OTHER): Payer: Medicare HMO | Admitting: Anesthesiology

## 2021-09-13 DIAGNOSIS — I2510 Atherosclerotic heart disease of native coronary artery without angina pectoris: Secondary | ICD-10-CM | POA: Diagnosis not present

## 2021-09-13 DIAGNOSIS — I10 Essential (primary) hypertension: Secondary | ICD-10-CM | POA: Insufficient documentation

## 2021-09-13 DIAGNOSIS — Z87891 Personal history of nicotine dependence: Secondary | ICD-10-CM | POA: Insufficient documentation

## 2021-09-13 DIAGNOSIS — N200 Calculus of kidney: Secondary | ICD-10-CM | POA: Diagnosis not present

## 2021-09-13 DIAGNOSIS — Z955 Presence of coronary angioplasty implant and graft: Secondary | ICD-10-CM | POA: Diagnosis not present

## 2021-09-13 HISTORY — PX: CYSTOSCOPY/URETEROSCOPY/HOLMIUM LASER/STENT PLACEMENT: SHX6546

## 2021-09-13 SURGERY — CYSTOSCOPY/URETEROSCOPY/HOLMIUM LASER/STENT PLACEMENT
Anesthesia: General | Site: Renal | Laterality: Right

## 2021-09-13 MED ORDER — CHLORHEXIDINE GLUCONATE 0.12 % MT SOLN
15.0000 mL | Freq: Once | OROMUCOSAL | Status: AC
  Administered 2021-09-13: 15 mL via OROMUCOSAL

## 2021-09-13 MED ORDER — OXYCODONE HCL 5 MG/5ML PO SOLN: 5.0000 mg | Freq: Once | ORAL | Status: AC | PRN

## 2021-09-13 MED ORDER — ONDANSETRON HCL 4 MG/2ML IJ SOLN
INTRAMUSCULAR | Status: DC | PRN
  Administered 2021-09-13: 4 mg via INTRAVENOUS

## 2021-09-13 MED ORDER — IOHEXOL 300 MG/ML  SOLN
INTRAMUSCULAR | Status: DC | PRN
  Administered 2021-09-13: 20 mL

## 2021-09-13 MED ORDER — PHENYLEPHRINE HCL (PRESSORS) 10 MG/ML IV SOLN
INTRAVENOUS | Status: AC
  Filled 2021-09-13: qty 1

## 2021-09-13 MED ORDER — PHENYLEPHRINE 40 MCG/ML (10ML) SYRINGE FOR IV PUSH (FOR BLOOD PRESSURE SUPPORT)
PREFILLED_SYRINGE | INTRAVENOUS | Status: AC
  Filled 2021-09-13: qty 10

## 2021-09-13 MED ORDER — PROPOFOL 10 MG/ML IV BOLUS
INTRAVENOUS | Status: AC
  Filled 2021-09-13: qty 20

## 2021-09-13 MED ORDER — EPHEDRINE 5 MG/ML INJ
INTRAVENOUS | Status: AC
  Filled 2021-09-13: qty 5

## 2021-09-13 MED ORDER — CLOPIDOGREL BISULFATE 75 MG PO TABS: 75.0000 mg | ORAL_TABLET | Freq: Every day | ORAL | 3 refills | Status: DC

## 2021-09-13 MED ORDER — ONDANSETRON HCL 4 MG/2ML IJ SOLN: 4.0000 mg | Freq: Once | INTRAMUSCULAR | Status: DC | PRN

## 2021-09-13 MED ORDER — OXYCODONE HCL 5 MG PO TABS
ORAL_TABLET | ORAL | Status: AC
  Administered 2021-09-13: 5 mg via ORAL
  Filled 2021-09-13: qty 1

## 2021-09-13 MED ORDER — LACTATED RINGERS IV SOLN: INTRAVENOUS | Status: DC

## 2021-09-13 MED ORDER — FENTANYL CITRATE PF 50 MCG/ML IJ SOSY
PREFILLED_SYRINGE | INTRAMUSCULAR | Status: AC
  Administered 2021-09-13: 50 ug via INTRAVENOUS
  Filled 2021-09-13: qty 1

## 2021-09-13 MED ORDER — FENTANYL CITRATE PF 50 MCG/ML IJ SOSY
25.0000 ug | PREFILLED_SYRINGE | INTRAMUSCULAR | Status: DC | PRN
  Administered 2021-09-13: 50 ug via INTRAVENOUS

## 2021-09-13 MED ORDER — DEXAMETHASONE SODIUM PHOSPHATE 10 MG/ML IJ SOLN
INTRAMUSCULAR | Status: DC | PRN
  Administered 2021-09-13: 10 mg via INTRAVENOUS

## 2021-09-13 MED ORDER — DEXAMETHASONE SODIUM PHOSPHATE 10 MG/ML IJ SOLN
INTRAMUSCULAR | Status: AC
  Filled 2021-09-13: qty 1

## 2021-09-13 MED ORDER — PHENYLEPHRINE HCL-NACL 20-0.9 MG/250ML-% IV SOLN
INTRAVENOUS | Status: DC | PRN
Start: 2021-09-13 — End: 2021-09-13
  Administered 2021-09-13: 50 ug/min via INTRAVENOUS

## 2021-09-13 MED ORDER — PHENYLEPHRINE 40 MCG/ML (10ML) SYRINGE FOR IV PUSH (FOR BLOOD PRESSURE SUPPORT)
PREFILLED_SYRINGE | INTRAVENOUS | Status: DC | PRN
  Administered 2021-09-13 (×2): 80 ug via INTRAVENOUS

## 2021-09-13 MED ORDER — LIDOCAINE 2% (20 MG/ML) 5 ML SYRINGE
INTRAMUSCULAR | Status: DC | PRN
  Administered 2021-09-13: 100 mg via INTRAVENOUS

## 2021-09-13 MED ORDER — CEFAZOLIN SODIUM-DEXTROSE 2-4 GM/100ML-% IV SOLN
INTRAVENOUS | Status: AC
  Filled 2021-09-13: qty 100

## 2021-09-13 MED ORDER — LIDOCAINE HCL (PF) 2 % IJ SOLN
INTRAMUSCULAR | Status: AC
  Filled 2021-09-13: qty 5

## 2021-09-13 MED ORDER — CEFAZOLIN SODIUM-DEXTROSE 2-4 GM/100ML-% IV SOLN
2.0000 g | INTRAVENOUS | Status: AC
  Administered 2021-09-13: 2 g via INTRAVENOUS

## 2021-09-13 MED ORDER — EPHEDRINE SULFATE-NACL 50-0.9 MG/10ML-% IV SOSY
PREFILLED_SYRINGE | INTRAVENOUS | Status: DC | PRN
  Administered 2021-09-13: 5 mg via INTRAVENOUS

## 2021-09-13 MED ORDER — ORAL CARE MOUTH RINSE: 15.0000 mL | Freq: Once | OROMUCOSAL | Status: AC

## 2021-09-13 MED ORDER — ONDANSETRON HCL 4 MG/2ML IJ SOLN
INTRAMUSCULAR | Status: AC
  Filled 2021-09-13: qty 2

## 2021-09-13 MED ORDER — OXYCODONE HCL 5 MG PO TABS
5.0000 mg | ORAL_TABLET | Freq: Once | ORAL | Status: AC | PRN
Start: 2021-09-13 — End: 2021-09-13

## 2021-09-13 MED ORDER — FENTANYL CITRATE (PF) 100 MCG/2ML IJ SOLN
INTRAMUSCULAR | Status: AC
  Filled 2021-09-13: qty 2

## 2021-09-13 MED ORDER — FENTANYL CITRATE (PF) 100 MCG/2ML IJ SOLN
INTRAMUSCULAR | Status: DC | PRN
Start: 2021-09-13 — End: 2021-09-13
  Administered 2021-09-13 (×3): 25 ug via INTRAVENOUS

## 2021-09-13 MED ORDER — PROPOFOL 10 MG/ML IV BOLUS
INTRAVENOUS | Status: DC | PRN
  Administered 2021-09-13: 110 mg via INTRAVENOUS
  Administered 2021-09-13: 30 mg via INTRAVENOUS
  Administered 2021-09-13: 40 mg via INTRAVENOUS

## 2021-09-13 SURGICAL SUPPLY — 17 items
BAG URO CATCHER STRL LF (MISCELLANEOUS) ×2 IMPLANT
CATH URETL OPEN END 6FR 70 (CATHETERS) ×1 IMPLANT
CLOTH BEACON ORANGE TIMEOUT ST (SAFETY) ×2 IMPLANT
COVER DOME SNAP 22 D (MISCELLANEOUS) ×1 IMPLANT
GLOVE SURG MICRO LTX SZ7 (GLOVE) ×2 IMPLANT
GOWN STRL REUS W/TWL LRG LVL3 (GOWN DISPOSABLE) ×2 IMPLANT
GUIDEWIRE STR DUAL SENSOR (WIRE) ×2 IMPLANT
GUIDEWIRE ZIPWRE .038 STRAIGHT (WIRE) ×2 IMPLANT
KIT TURNOVER KIT A (KITS) ×1 IMPLANT
MANIFOLD NEPTUNE II (INSTRUMENTS) ×2 IMPLANT
PACK CYSTO (CUSTOM PROCEDURE TRAY) ×2 IMPLANT
SHEATH URETERAL 12FR 45CM (SHEATH) ×1 IMPLANT
STENT URET 6FRX26 CONTOUR (STENTS) ×1 IMPLANT
TRACTIP FLEXIVA PULS ID 200XHI (Laser) IMPLANT
TRACTIP FLEXIVA PULSE ID 200 (Laser) ×2
TUBING CONNECTING 10 (TUBING) ×2 IMPLANT
TUBING UROLOGY SET (TUBING) ×2 IMPLANT

## 2021-09-13 NOTE — Transfer of Care (Signed)
Immediate Anesthesia Transfer of Care Note  Patient: Worthy Flank, MD  Procedure(s) Performed: RIGHT URETEROSCOPY/HOLMIUM LASER/STENT PLACEMENT (Right: Renal)  Patient Location: PACU  Anesthesia Type:General  Level of Consciousness: awake and alert   Airway & Oxygen Therapy: Patient Spontanous Breathing and Patient connected to face mask oxygen  Post-op Assessment: Report given to RN and Post -op Vital signs reviewed and stable  Post vital signs: Reviewed and stable  Last Vitals:  Vitals Value Taken Time  BP    Temp    Pulse    Resp    SpO2      Last Pain:  Vitals:   09/13/21 1028  TempSrc: Oral         Complications: No notable events documented.

## 2021-09-13 NOTE — Anesthesia Procedure Notes (Signed)
Procedure Name: LMA Insertion Date/Time: 09/13/2021 1:13 PM Performed by: Sharlette Dense, CRNA Patient Re-evaluated:Patient Re-evaluated prior to induction Oxygen Delivery Method: Circle system utilized Preoxygenation: Pre-oxygenation with 100% oxygen Induction Type: IV induction LMA: LMA inserted LMA Size: 4.0 Number of attempts: 1 Placement Confirmation: positive ETCO2 and breath sounds checked- equal and bilateral Tube secured with: Tape Dental Injury: Teeth and Oropharynx as per pre-operative assessment

## 2021-09-13 NOTE — H&P (Signed)
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 Options were discussed and Dr [name] elected to proceed with ureteroscopy with laser lithotripsy and stent placement  Past Medical History:  Diagnosis Date   Arthritis    hip.[email or web address]   Coronary artery disease    Dysrhythmia    History of kidney stones    Hypertension    Kidney stones    Meniere's disease     Past Surgical History:  Procedure Laterality Date   BACK SURGERY     L3-L4   CARDIAC CATHETERIZATION  2012   3 stents   CORONARY ANGIOPLASTY WITH STENT PLACEMENT  1996   3 stents   INGUINAL HERNIA REPAIR Left    INGUINAL HERNIA REPAIR Right    LAMINECTOMY     L4-L5    Home Medications:  Current Meds  Medication Sig   aspirin 325 MG tablet Take 325 mg by mouth daily.   atorvastatin (LIPITOR) 40 MG tablet Take 1 tablet (40 mg total) by mouth daily. (Patient taking differently: Take 20 mg by mouth at bedtime.)   clopidogrel (PLAVIX) 75 MG tablet Take 1 tablet (75 mg total) by mouth daily.   fexofenadine (ALLEGRA) 60 MG tablet Take 60 mg by mouth daily as needed for allergies or rhinitis.   losartan (COZAAR) 100 MG tablet Take 1 tablet (100 mg total) by mouth daily.   metoprolol succinate (TOPROL-XL) 25 MG 24 hr tablet Take 1 tablet (25 mg total) by mouth daily.   Naproxen Sodium 220 MG CAPS Take 220 mg by mouth daily as needed (Pain).   nitroGLYCERIN (NITROSTAT) 0.4 MG SL tablet Place 0.4 mg under the tongue every 5 (five) minutes as needed for chest pain.   pramipexole (MIRAPEX) 0.25 MG tablet Take three to four po qhs prn restless leg symptoms   tamsulosin (FLOMAX) 0.4 MG CAPS capsule Take 1 capsule (0.4 mg total) by mouth daily after supper.    Allergies: No Known Allergies  Family History  Problem Relation Age of Onset   Other Mother 89       subdural hematoma- deceased   Stroke Father 14       deceased    Social History:  reports  that he quit smoking about 56 years ago. His smoking use included cigarettes. He has a 28.00 pack-year smoking history. He has never used smokeless tobacco. He reports current alcohol use of about 14.0 standard drinks per week. He reports that he does not use drugs.  ROS: A complete review of systems was performed.  All systems are negative except for pertinent findings as noted.  Physical Exam:  Vital signs in last 24 hours: Temp:  [98.1 F (36.7 C)] 98.1 F (36.7 C) (02/14 1028) Pulse Rate:  [72] 72 (02/14 1028) Resp:  [15] 15 (02/14 1028) BP: (194)/(101) 194/101 (02/14 1028) Constitutional:  Alert and oriented, No acute distress Cardiovascular: Regular rate and rhythm, No JVD Respiratory: Normal respiratory effort, Lungs clear bilaterally GI: Abdomen is soft, nontender, nondistended, no abdominal masses GU: No CVA tenderness Lymphatic: No lymphadenopathy Neurologic: Grossly intact, no focal deficits Psychiatric: Normal mood and affect   Laboratory Data:  Recent Labs    09/12/21 0824  WBC 6.4  HGB 15.3  HCT 44.4  PLT 217    Recent Labs    09/12/21 0824  NA 136  K 3.8  CL 108  GLUCOSE 106*  BUN 22  CALCIUM 8.7*  CREATININE 1.37*     No results found for this or any previous visit (from the past 24 hour(s)). No results found for this or any previous visit (from the past 240 hour(s)).  Renal Function: Recent Labs    09/12/21 0824  CREATININE 1.37*   Estimated Creatinine Clearance: 40.5 mL/min (A) (by C-G formula based on SCr of 1.37 mg/dL (H)).  Radiologic Imaging: No results found.  Assessment:  88 yo M with stone in R collecting system  Plan:  To OR for ureteroscopy with laser lithotripsy. We reviewed all risks of operation. We discussed if I am not able to access the stone safely, I would place a stent and plan for staged ureteroscopy. Dr [name] voiced understanding  Donald Pore, MD 09/13/2021, 1:01 PM  [facility] Pager: [phone]

## 2021-09-13 NOTE — Op Note (Signed)
Operative Note  Preoperative diagnosis:  1.  Right renal stone  Postoperative diagnosis: 1.  Right renal stone  Procedure(s): 1.  Right ureteroscopy with laser lithotripsy and basket extraction of stones 2. Cystoscopy  3. Right retrograde pyelogram 4. Right ureteral stent placement 6x26 cm 5. Fluoroscopy with intraoperative interpretation  Surgeon: Donald Pore, MD  Assistants:  None  Anesthesia:  General  Complications:  None  EBL:  Minimal  Specimens: None (stones dusted)  Drains/Catheters: 1.  Right 6Fr x 26cm ureteral stent on tether string  Intraoperative findings:   Cystoscopy demonstrated unremarkable bladder, some bilobar prostate enlargement R Ureteroscopy demonstrated a stone in the collecting system Successful stent placement.  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 He elected to proceed with ureteroscopy with laser litho  Description of procedure: After informed consent was obtained from the patient, the patient was identified and taken to the operating room and placed in the supine position.  General anesthesia was administered as well as perioperative IV antibiotics.  At the beginning of the case, a time-out was performed to properly identify the patient, the surgery to be performed, and the surgical site.  Sequential compression devices were applied to the lower extremities at the beginning of the case for DVT prophylaxis.  The patient was then placed in the dorsal lithotomy supine position, prepped and draped in sterile fashion.  Preliminary scout fluoroscopy revealed that there was a large calcification area at the collecting system, which corresponds to the stone found on the preoperative CT scan. We then passed the 21-French rigid cystoscope through the urethra and into the bladder under vision without any difficulty, noting a normal urethra without  strictures and some bilobar enlargement of his prostate.  A systematic evaluation of the bladder revealed no evidence of any suspicious bladder lesions.  Ureteral orifices were in normal position.    The right UO was cannulated with a zip wire which was easily advanced into the collecting system. The cystoscope was withdrawn, and a dual lumen catheter was inserted over the glide wire into the distal ureter. A gentle retrograde pyelogram was performed, revealing a normal caliber ureter without any filling defects. There was no hydronephrosis of the collecting system. There was a filling defect in the kidney corresponding to the stone. A 0.038 sensor wire was then passed up to the level of the renal pelvis and secured to the drape as a safety wire. The dual lumen was removed.  An 11/13Fr ureteral access sheath was carefully advanced up the ureter to the level of the UPJ over this wire under fluoroscopic guidance. The flexible ureteroscope was advanced into the collecting system via the access sheath. The collecting system was inspected. The calculus was identified in the collecting system. Using the 272 micron holmium laser fiber, the stone was dusted completely. With the ureteroscope in the kidney, a gentle pyelogram was performed to delineate the calyceal system and we evaluated the calyces systematically. The rest of the stone fragments were very tiny and these were  irrigated away gently; no fragments >2 mm persisted. The calyces were re-inspected and there were no significant stone fragment residual.   We then withdrew the ureteroscope back down the ureter along with the access sheath, noting no evidence of any stones along the course of the ureter.  Prior to removing the ureteroscope, we did pass the Glidewire back up to the ureter  to the renal pelvis.  Once the ureteroscope was removed, we then used the Glidewire under fluoroscopic guidance and passed up a 6-French x 26 cm double-pigtail ureteral stent up  the ureter, making sure that the proximal and distal ends coiled within the kidney and bladder respectively.  Note that we left a tether string attached to the distal end of the ureteral stent and it exited the urethral meatus and was secured to the penile shaft with a tegaderm.  The cystoscope was then advanced back into the bladder under vision.  We were able to see the distal stent coiling nicely within the bladder.  The bladder was then emptied with irrigation solution.  The cystoscope was then removed.    The patient tolerated the procedure well and there was no complication. Patient was awoken from anesthesia and taken to the recovery room in stable condition. I was present and scrubbed for the entirety of the case.  Plan:  Patient will be discharged home with instructions to remove stent Friday morning   Daryll Brod MD [facility]  Pager: [phone]

## 2021-09-13 NOTE — Anesthesia Preprocedure Evaluation (Signed)
Anesthesia Evaluation  Patient identified by MRN, date of birth, ID band Patient awake    Reviewed: Allergy & Precautions, NPO status , Patient's Chart, lab work & pertinent test results  Airway Mallampati: II  TM Distance: >3 FB Neck ROM: Limited    Dental no notable dental hx.    Pulmonary neg pulmonary ROS, former smoker,    Pulmonary exam normal breath sounds clear to auscultation       Cardiovascular hypertension, Pt. on medications and Pt. on home beta blockers + CAD and + Cardiac Stents  Normal cardiovascular exam Rhythm:Regular Rate:Normal     Neuro/Psych negative neurological ROS  negative psych ROS   GI/Hepatic negative GI ROS, Neg liver ROS,   Endo/Other  negative endocrine ROS  Renal/GU negative Renal ROS  negative genitourinary   Musculoskeletal negative musculoskeletal ROS (+)   Abdominal   Peds negative pediatric ROS (+)  Hematology negative hematology ROS (+)   Anesthesia Other Findings   Reproductive/Obstetrics negative OB ROS                             Anesthesia Physical Anesthesia Plan  ASA: 3  Anesthesia Plan: General   Post-op Pain Management: Minimal or no pain anticipated   Induction: Intravenous  PONV Risk Score and Plan: 2 and Ondansetron, Dexamethasone and Treatment may vary due to age or medical condition  Airway Management Planned: LMA  Additional Equipment:   Intra-op Plan:   Post-operative Plan: Extubation in OR  Informed Consent: I have reviewed the patients History and Physical, chart, labs and discussed the procedure including the risks, benefits and alternatives for the proposed anesthesia with the patient or authorized representative who has indicated his/her understanding and acceptance.     Dental advisory given  Plan Discussed with: CRNA and Surgeon  Anesthesia Plan Comments:         Anesthesia Quick Evaluation

## 2021-09-13 NOTE — Anesthesia Postprocedure Evaluation (Signed)
Anesthesia Post Note  Patient: Worthy Flank, MD  Procedure(s) Performed: RIGHT URETEROSCOPY/HOLMIUM LASER/STENT PLACEMENT (Right: Renal)     Patient location during evaluation: PACU Anesthesia Type: General Level of consciousness: awake and alert Pain management: pain level controlled Vital Signs Assessment: post-procedure vital signs reviewed and stable Respiratory status: spontaneous breathing, nonlabored ventilation, respiratory function stable and patient connected to nasal cannula oxygen Cardiovascular status: blood pressure returned to baseline and stable Postop Assessment: no apparent nausea or vomiting Anesthetic complications: no   No notable events documented.  Last Vitals:  Vitals:   09/13/21 1510 09/13/21 1530  BP: (!) 166/91 (!) 156/93  Pulse: 76 74  Resp: 12 13  Temp:    SpO2: 100% 95%    Last Pain:  Vitals:   09/13/21 1630  TempSrc:   PainSc: 6                  Hargis Vandyne S

## 2021-09-13 NOTE — Discharge Instructions (Addendum)
[facility] [phone] Post Ureteroscopy With or Without Stent Instructions  **remove stent by pulling on string on Friday (2/17) morning **resume plavix on Thursday (2/16)  Definitions:  Ureter: The duct that transports urine from the kidney to the bladder. Stent:   A plastic hollow tube that is placed into the ureter, from the kidney to the bladder to prevent the ureter from swelling shut.  GENERAL INSTRUCTIONS:  Despite the fact that no skin incisions were used, the area around the ureter and bladder is raw and irritated. The stent is a foreign body which will further irritate the bladder wall. This irritation is manifested by increased frequency of urination, both day and night, and by an increase in the urge to urinate. In some, the urge to urinate is present almost always. Sometimes the urge is strong enough that you may not be able to stop yourself from urinating. The only real cure is to remove the stent and then give time for the bladder wall to heal which can't be done until the danger of the ureter swelling shut has passed, which varies.  You may see some blood in your urine while the stent is in place and a few days afterwards. Do not be alarmed, even if the urine was clear for a while. Get off your feet and drink lots of fluids until clearing occurs. If you start to pass clots or don't improve, call us.  DIET: You may return to your normal diet immediately. Because of the raw surface of your bladder, alcohol, spicy foods, acid type foods and drinks with caffeine may cause irritation or frequency and should be used in moderation. To keep your urine flowing freely and to avoid constipation, drink plenty of fluids during the day ( 8-10 glasses ). Tip: Avoid cranberry juice because it is very acidic.  ACTIVITY: Your physical activity doesn't need to be restricted. However, if you are very active, you may see some blood in your urine. We suggest that you reduce your  activity under these circumstances until the bleeding has stopped.  BOWELS: It is important to keep your bowels regular during the postoperative period. Straining with bowel movements can cause bleeding. A bowel movement every other day is reasonable. Use a mild laxative if needed, such as Milk of Magnesia 2-3 tablespoons, or 2 Dulcolax tablets. Call if you continue to have problems. If you have been taking narcotics for pain, before, during or after your surgery, you may be constipated. Take a laxative if necessary.   MEDICATION: You should resume your pre-surgery medications unless told not to. In addition you will often be given an antibiotic to prevent infection and likely several as needed medications for stent related discomfort. These should be taken as prescribed until the bottles are finished unless you are having an unusual reaction to one of the drugs. *Re-start your plavix and aspirin on 2/16.  PROBLEMS YOU SHOULD REPORT TO [place]: Fevers over 100.5 Fahrenheit. Heavy bleeding, or clots ( See above notes about blood in urine ). Inability to urinate. Drug reactions ( hives, rash, nausea, vomiting, diarrhea ). Severe burning or pain with urination that is not improving.

## 2021-09-14 ENCOUNTER — Encounter (HOSPITAL_COMMUNITY): Payer: Self-pay | Admitting: Urology

## 2021-09-14 ENCOUNTER — Other Ambulatory Visit: Payer: Self-pay

## 2021-10-24 DIAGNOSIS — N200 Calculus of kidney: Secondary | ICD-10-CM | POA: Diagnosis not present

## 2021-11-01 DIAGNOSIS — Z85828 Personal history of other malignant neoplasm of skin: Secondary | ICD-10-CM | POA: Diagnosis not present

## 2021-11-01 DIAGNOSIS — L570 Actinic keratosis: Secondary | ICD-10-CM | POA: Diagnosis not present

## 2021-11-29 ENCOUNTER — Telehealth: Payer: Self-pay | Admitting: Family Medicine

## 2021-11-29 NOTE — Telephone Encounter (Signed)
Pt is calling and would like dr burchette to return his call he said its personal ?

## 2021-11-30 ENCOUNTER — Telehealth: Payer: Self-pay | Admitting: Family Medicine

## 2021-12-09 NOTE — Telephone Encounter (Signed)
error 

## 2022-03-10 ENCOUNTER — Encounter: Payer: Self-pay | Admitting: Family Medicine

## 2022-03-10 ENCOUNTER — Ambulatory Visit (INDEPENDENT_AMBULATORY_CARE_PROVIDER_SITE_OTHER): Payer: Medicare HMO | Admitting: Family Medicine

## 2022-03-10 VITALS — BP 164/80 | HR 85 | Temp 97.7°F | Ht 68.9 in | Wt 187.9 lb

## 2022-03-10 DIAGNOSIS — Z0000 Encounter for general adult medical examination without abnormal findings: Secondary | ICD-10-CM

## 2022-03-10 DIAGNOSIS — N400 Enlarged prostate without lower urinary tract symptoms: Secondary | ICD-10-CM

## 2022-03-10 LAB — LIPID PANEL
Cholesterol: 169 mg/dL (ref 0–200)
HDL: 45.1 mg/dL (ref >39.00)
LDL Cholesterol: 99 mg/dL (ref 0–99)
NonHDL: 123.85
Total CHOL/HDL Ratio: 4
Triglycerides: 126 mg/dL (ref 0.0–149.0)
VLDL: 25.2 mg/dL (ref 0.0–40.0)

## 2022-03-10 LAB — BASIC METABOLIC PANEL
BUN: 33 mg/dL — ABNORMAL HIGH (ref 6–23)
CO2: 24 mEq/L (ref 19–32)
Calcium: 8.7 mg/dL (ref 8.4–10.5)
Chloride: 108 mEq/L (ref 96–112)
Creatinine, Ser: 1.32 mg/dL (ref 0.40–1.50)
GFR: 48.38 mL/min — ABNORMAL LOW (ref >60.00)
Glucose, Bld: 93 mg/dL (ref 70–99)
Potassium: 4.1 mEq/L (ref 3.5–5.1)
Sodium: 139 mEq/L (ref 135–145)

## 2022-03-10 LAB — CBC WITH DIFFERENTIAL/PLATELET
Basophils Absolute: 0 10*3/uL (ref 0.0–0.1)
Basophils Relative: 0.7 % (ref 0.0–3.0)
Eosinophils Absolute: 0.1 10*3/uL (ref 0.0–0.7)
Eosinophils Relative: 2.2 % (ref 0.0–5.0)
HCT: 41.1 % (ref 39.0–52.0)
Hemoglobin: 13.9 g/dL (ref 13.0–17.0)
Lymphocytes Relative: 35.1 % (ref 12.0–46.0)
Lymphs Abs: 2.1 10*3/uL (ref 0.7–4.0)
MCHC: 33.8 g/dL (ref 30.0–36.0)
MCV: 93.2 fl (ref 78.0–100.0)
Monocytes Absolute: 0.5 10*3/uL (ref 0.1–1.0)
Monocytes Relative: 8.6 % (ref 3.0–12.0)
Neutro Abs: 3.1 10*3/uL (ref 1.4–7.7)
Neutrophils Relative %: 53.4 % (ref 43.0–77.0)
Platelets: 194 10*3/uL (ref 150.0–400.0)
RBC: 4.41 Mil/uL (ref 4.22–5.81)
RDW: 14.9 % (ref 11.5–15.5)
WBC: 5.8 10*3/uL (ref 4.0–10.5)

## 2022-03-10 LAB — TSH: TSH: 2 u[IU]/mL (ref 0.35–5.50)

## 2022-03-10 MED ORDER — LOSARTAN POTASSIUM-HCTZ 100-12.5 MG PO TABS: 1.0000 | ORAL_TABLET | Freq: Every day | ORAL | 3 refills | Status: DC

## 2022-03-10 MED ORDER — ATORVASTATIN CALCIUM 40 MG PO TABS: 40.0000 mg | ORAL_TABLET | Freq: Every day | ORAL | 3 refills | Status: DC

## 2022-03-10 MED ORDER — TAMSULOSIN HCL 0.4 MG PO CAPS: 0.4000 mg | ORAL_CAPSULE | Freq: Every day | ORAL | 3 refills | Status: DC

## 2022-03-10 NOTE — Patient Instructions (Signed)
Consider Shingrix vaccine  Remember flu vaccine this Fall.

## 2022-03-10 NOTE — Progress Notes (Signed)
Established Patient Office Visit  Subjective   Patient ID: Ronald Flank, MD, male    DOB: [birth year]  Age: 88 y.o. 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  He has history of hypertension, Mnire's disease, restless leg syndrome, hyperlipidemia.  Medications include atorvastatin, Plavix, losartan, Mirapex, Flomax.  Generally doing well.  Not playing much golf this year.  [place]  No recent falls.  Appetite and weight stable.  He has had some mild edema worse late in the day over the past several months.  No dyspnea with ambulation.  No orthopnea.  Brings in home blood pressures for review.  He had several early morning readings around 160 but tends to improve throughout the day.  Currently on losartan 100 mg daily and low-dose metoprolol  Health maintenance reviewed  -No history of shingles vaccine -Tetanus updated 2021 -Pneumonia vaccines complete -Aged out of colonoscopy screening.  Social history retired OB/GYN retired around age 25.  Quit smoking 1967.  About 28-pack-year history.  Occasional wine use.  Married.  Family history-father died around age 38 of stroke.  Mother died of subdural hematoma in her 9s  Past Medical History:  Diagnosis Date   Arthritis    hip.[email or web address]   Coronary artery disease    Dysrhythmia    History of kidney stones    Hypertension    Kidney stones    Meniere's disease    Past Surgical History:  Procedure Laterality Date   BACK SURGERY     L3-L4   CARDIAC CATHETERIZATION  2012   3 stents   CORONARY ANGIOPLASTY WITH STENT PLACEMENT  1996   3 stents   CYSTOSCOPY/URETEROSCOPY/HOLMIUM LASER/STENT PLACEMENT Right 09/13/2021   Procedure: RIGHT URETEROSCOPY/HOLMIUM LASER/STENT PLACEMENT;  Surgeon: Vira Agar, MD;  Location: [facility];  Service: Urology;  Laterality: Right;   INGUINAL HERNIA REPAIR Left    INGUINAL HERNIA  REPAIR Right    LAMINECTOMY     L4-L5    reports that he quit smoking about 56 years ago. His smoking use included cigarettes. He has a 28.00 pack-year smoking history. He has never used smokeless tobacco. He reports current alcohol use of about 14.0 standard drinks of alcohol per week. He reports that he does not use drugs. family history includes Other (age of onset: 7) in his mother; Stroke (age of onset: 24) in his father. No Known Allergies  Review of Systems  Constitutional:  Negative for malaise/fatigue.  Eyes:  Negative for blurred vision.  Respiratory:  Negative for shortness of breath.   Cardiovascular:  Negative for chest pain.  Gastrointestinal:  Negative for nausea and vomiting.  Neurological:  Negative for dizziness, weakness and headaches.      Objective:     BP (!) 164/80 (BP Location: Left Arm, Patient Position: Sitting, Cuff Size: Normal)   Pulse 85   Temp 97.7 F (36.5 C) (Oral)   Ht 5' 8.9" (1.75 m)   Wt 187 lb 14.4 oz (85.2 kg)   SpO2 98%   BMI 27.83 kg/m    Physical Exam Constitutional:      Appearance: He is well-developed.  HENT:     Right Ear: External ear normal.     Left Ear: External ear normal.  Eyes:     Pupils: Pupils are equal, round, and reactive to light.  Neck:  Thyroid: No thyromegaly.  Cardiovascular:     Rate and Rhythm: Normal rate and regular rhythm.  Pulmonary:     Effort: Pulmonary effort is normal. No respiratory distress.     Breath sounds: Normal breath sounds. No wheezing or rales.  Musculoskeletal:     Cervical back: Neck supple.     Comments: Trace pitting edema lower legs bilaterally  Neurological:     Mental Status: He is alert and oriented to person, place, and time.      No results found for any visits on 03/10/22.    The ASCVD Risk score (Arnett DK, et al., 2019) failed to calculate for the following reasons:   The 2019 ASCVD risk score is only valid for ages 32 to 59    Assessment & Plan:    Problem List Items Addressed This Visit   None Visit Diagnoses     Physical exam    -  Primary   Relevant Orders   Basic metabolic panel   Lipid panel   CBC with Differential/Platelet   TSH     -Has had some recent elevated blood pressures consistently especially in the mornings.  Recent mild lower extremity edema worse late day and suspected venous stasis.  No dyspnea.  Weight stable.  -Continue watch sodium intake closely  -We discussed changing losartan 100 mg daily to losartan HCTZ 100/12.5 mg daily.  Increase potassium rich foods -Continue to monitor blood pressure and be in touch in 1 month if not seeing consistent systolic readings less than 140  -Obtain labs as above  -Remember flu vaccine this fall.  Also discussed Shingrix and recommend he consider that at some point this year  No follow-ups on file.    Carolann Littler, MD

## 2022-03-11 ENCOUNTER — Other Ambulatory Visit: Payer: Self-pay | Admitting: Family Medicine

## 2022-04-04 ENCOUNTER — Other Ambulatory Visit: Payer: Self-pay | Admitting: Family Medicine

## 2022-05-21 ENCOUNTER — Other Ambulatory Visit: Payer: Self-pay | Admitting: Family Medicine

## 2022-07-05 ENCOUNTER — Telehealth: Payer: Self-pay | Admitting: *Deleted

## 2022-07-05 NOTE — Patient Outreach (Signed)
  Care Coordination   07/05/2022 Name: Ronald Stollings, MD MRN: [id number] DOB: [birth year]   Care Coordination Outreach Attempts:  An unsuccessful telephone outreach was attempted today to offer the patient information about available care coordination services as a benefit of their health plan.   Follow Up Plan:  Additional outreach attempts will be made to offer the patient care coordination information and services.   Encounter Outcome:  No Answer   Care Coordination Interventions:  No, not indicated    Raina Mina, RN Care Management Coordinator [place] Office [phone]

## 2022-07-10 DIAGNOSIS — I872 Venous insufficiency (chronic) (peripheral): Secondary | ICD-10-CM | POA: Diagnosis not present

## 2022-07-10 DIAGNOSIS — L570 Actinic keratosis: Secondary | ICD-10-CM | POA: Diagnosis not present

## 2022-07-10 DIAGNOSIS — D1801 Hemangioma of skin and subcutaneous tissue: Secondary | ICD-10-CM | POA: Diagnosis not present

## 2022-07-10 DIAGNOSIS — I8312 Varicose veins of left lower extremity with inflammation: Secondary | ICD-10-CM | POA: Diagnosis not present

## 2022-07-10 DIAGNOSIS — Z85828 Personal history of other malignant neoplasm of skin: Secondary | ICD-10-CM | POA: Diagnosis not present

## 2022-07-10 DIAGNOSIS — L821 Other seborrheic keratosis: Secondary | ICD-10-CM | POA: Diagnosis not present

## 2022-07-10 DIAGNOSIS — I8311 Varicose veins of right lower extremity with inflammation: Secondary | ICD-10-CM | POA: Diagnosis not present

## 2022-07-10 DIAGNOSIS — B0089 Other herpesviral infection: Secondary | ICD-10-CM | POA: Diagnosis not present

## 2022-07-17 ENCOUNTER — Ambulatory Visit: Payer: Medicare HMO

## 2022-07-17 ENCOUNTER — Ambulatory Visit (INDEPENDENT_AMBULATORY_CARE_PROVIDER_SITE_OTHER): Payer: Medicare HMO

## 2022-07-17 VITALS — Ht 69.0 in | Wt 187.0 lb

## 2022-07-17 DIAGNOSIS — Z0000 Encounter for general adult medical examination without abnormal findings: Secondary | ICD-10-CM

## 2022-07-17 NOTE — Progress Notes (Signed)
Subjective:   Ronald Flank, Ronald Case is a 88 y.o. male who presents for Medicare Annual/Subsequent preventive examination.  Review of Systems    Virtual Visit via Telephone Note  I connected with  Ronald Flank, Ronald Case on 07/17/22 at  1:30 PM EST by telephone and verified that I am speaking with the correct person using two identifiers.  Location: Patient: Home Provider: Office Persons participating in the virtual visit: patient/Nurse Health Advisor   I discussed the limitations, risks, security and privacy concerns of performing an evaluation and management service by telephone and the availability of in person appointments. The patient expressed understanding and agreed to proceed.  Interactive audio and video telecommunications were attempted between this nurse and patient, however failed, due to patient having technical difficulties OR patient did not have access to video capability.  We continued and completed visit with audio only.  Some vital signs may be absent or patient reported.   Criselda Peaches, LPN  Cardiac Risk Factors include: advanced age (>36mn, >>34women);hypertension;male gender     Objective:    Today's Vitals   07/17/22 1335  Weight: 187 lb (84.8 kg)  Height: '5\' 9"'$  (1.753 m)   Body mass index is 27.62 kg/m.     07/17/2022    1:42 PM 09/12/2021    8:16 AM 07/11/2021    1:17 PM 07/13/2020    3:42 PM  Advanced Directives  Does Patient Have a Medical Advance Directive? Yes Yes Yes Yes  Type of AParamedicof [place]Living will H[place]. [place]Living will HH[place]Living will [place]le[place]Living will  Does patient want to make changes to medical advance directive?    No - Patient declined  Copy of [place] Chart? No - copy requested  No - copy requested No - copy requested    Current Medications (verified) Outpatient Encounter Medications as of  07/17/2022  Medication Sig   aspirin 325 MG tablet Take 325 mg by mouth daily.   atorvastatin (LIPITOR) 40 MG tablet Take 1 tablet (40 mg total) by mouth daily.   clopidogrel (PLAVIX) 75 MG tablet Take 1 tablet (75 mg total) by mouth daily.   fexofenadine (ALLEGRA) 60 MG tablet Take 60 mg by mouth daily as needed for allergies or rhinitis.   losartan-hydrochlorothiazide (HYZAAR) 100-12.5 MG tablet Take 1 tablet by mouth daily.   metoprolol succinate (TOPROL-XL) 25 MG 24 hr tablet TAKE ONE TABLET BY MOUTH DAILY   Naproxen Sodium 220 MG CAPS Take 220 mg by mouth daily as needed (Pain).   nitroGLYCERIN (NITROSTAT) 0.4 MG SL tablet Place 0.4 mg under the tongue every 5 (five) minutes as needed for chest pain.   pramipexole (MIRAPEX) 0.25 MG tablet TAKE THREE TO FOUR TABLETS BY MOUTH EVERY NIGHT AT BEDTIME AS NEEDED FOR RESTLESS LEG SYMPTOMS   tamsulosin (FLOMAX) 0.4 MG CAPS capsule Take 1 capsule (0.4 mg total) by mouth daily after supper.   No facility-administered encounter medications on file as of 07/17/2022.    Allergies (verified) Patient has no known allergies.   History: Past Medical History:  Diagnosis Date   Arthritis    hip.knees   Coronary artery disease    Dysrhythmia    History of kidney stones    Hypertension    Kidney stones    Meniere's disease    Past Surgical History:  Procedure Laterality Date   BACK SURGERY     L3-L4   CARDIAC  CATHETERIZATION  2012   3 stents   CORONARY ANGIOPLASTY WITH STENT PLACEMENT  1996   3 stents   CYSTOSCOPY/URETEROSCOPY/HOLMIUM LASER/STENT PLACEMENT Right 09/13/2021   Procedure: RIGHT URETEROSCOPY/HOLMIUM LASER/STENT PLACEMENT;  Surgeon: Vira Agar, Ronald Case;  Location: [facility];  Service: Urology;  Laterality: Right;   INGUINAL HERNIA REPAIR Left    INGUINAL HERNIA REPAIR Right    LAMINECTOMY     L4-L5   Family History  Problem Relation Age of Onset   Other Mother 60       subdural hematoma- deceased   Stroke Father 54        deceased   Social History   Socioeconomic History   Marital status: Married    Spouse name: Not on file   Number of children: Not on file   Years of education: Not on file   Highest education level: Not on file  Occupational History   Not on file  Tobacco Use   Smoking status: Former    Packs/day: 2.00    Years: 14.00    Total pack years: 28.00    Types: Cigarettes    Quit date: 07/31/1965    Years since quitting: 57.0   Smokeless tobacco: Never  Vaping Use   Vaping Use: Never used  Substance and Sexual Activity   Alcohol use: Yes    Alcohol/week: 14.0 standard drinks of alcohol    Types: 14 Standard drinks or equivalent per week    Comment: 2/day   Drug use: No   Sexual activity: Not on file  Other Topics Concern   Not on file  Social History Narrative   Not on file   Social Determinants of Health   Financial Resource Strain: Low Risk  (07/17/2022)   Overall Financial Resource Strain (CARDIA)    Difficulty of Paying Living Expenses: Not hard at all  Food Insecurity: No Food Insecurity (07/17/2022)   Hunger Vital Sign    Worried About Running Out of Food in the Last Year: Never true    Ran Out of Food in the Last Year: Never true  Transportation Needs: No Transportation Needs (07/17/2022)   PRAPARE - Hydrologist (Medical): No    Lack of Transportation (Non-Medical): No  Physical Activity: Inactive (07/17/2022)   Exercise Vital Sign    Days of Exercise per Week: 0 days    Minutes of Exercise per Session: 0 min  Stress: No Stress Concern Present (07/17/2022)   [place]    Feeling of Stress : Not at all  Social Connections: [place] (07/17/2022)   Social Connection and Isolation Panel [NHANES]    Frequency of Communication with Friends and Family: More than three times a week    Frequency of Social Gatherings with Friends and Family: More than three times a  week    Attends Religious Services: More than 4 times per year    Active Member of [employer] or Organizations: Yes    Attends Music therapist: More than 4 times per year    Marital Status: Married    Tobacco Counseling Counseling given: Not Answered   Clinical Intake:  Pre-visit preparation completed: No  Pain : No/denies pain     BMI - recorded: 27.67 Nutritional Risks: None Diabetes: No  How often do you need to have someone help you when you read instructions, pamphlets, or other written materials from your doctor or pharmacy?: 1 - Never  Diabetic?  No  Interpreter Needed?: No  Information entered by :: Rolene Arbour LPN   Activities of Daily Living    07/17/2022    1:41 PM 09/12/2021    8:14 AM  In your present state of health, do you have any difficulty performing the following activities:  Hearing? 0 0  Vision? 0 0  Difficulty concentrating or making decisions? 0 1  Comment  some memory loss  Walking or climbing stairs? 0 0  Dressing or bathing? 0 0  Doing errands, shopping? 0   Preparing Food and eating ? N   Using the Toilet? N   In the past six months, have you accidently leaked urine? N   Do you have problems with loss of bowel control? N   Managing your Medications? N   Managing your Finances? N   Housekeeping or managing your Housekeeping? N     Patient Care Team: [name], Ronald Case as PCP - General (Family Medicine) Viona Gilmore, [facility] (Pharmacist) Viona Gilmore, [facility] as Pharmacist (Pharmacist)  Indicate any recent Medical Services you may have received from other than [facility] providers in the past year (date may be approximate).     Assessment:   This is a routine wellness examination for Jevon.  Hearing/Vision screen Hearing Screening - Comments:: Denies hearing difficulties   Vision Screening - Comments:: Wears rx glasses - up to date with routine eye exams with  Dr Bing Plume  Dietary issues and exercise activities  discussed: Current Exercise Habits: The patient does not participate in regular exercise at present, Exercise limited by: None identified   Goals Addressed               This Visit's Progress     Patient Stated (pt-stated)        I will continue to play golf        Depression Screen    07/17/2022    1:40 PM 03/10/2022    8:07 AM 07/11/2021    1:18 PM 07/13/2020    3:45 PM 03/08/2020    9:06 AM 03/04/2019   10:06 AM  PHQ 2/9 Scores  PHQ - 2 Score 0 0 0 0 0 0  PHQ- 9 Score  0  0  0    Fall Risk    07/17/2022    1:41 PM 03/10/2022    8:08 AM 07/11/2021    1:18 PM 07/13/2020    3:43 PM 03/08/2020    9:06 AM  [place] in the past year? 0 1 0 1 0  Number falls in past yr: 0 0  0 0  Injury with Fall? 0 0  0 0  Risk for fall due to : No Fall Risks No Fall Risks Medication side effect History of fall(s)   Follow up Falls prevention discussed Falls evaluation completed Falls evaluation completed;Education provided;Falls prevention discussed Falls evaluation completed;Falls prevention discussed     FALL RISK PREVENTION PERTAINING TO THE HOME:  Any stairs in or around the home? Yes  If so, are there any without handrails? No  Home free of loose throw rugs in walkways, pet beds, electrical cords, etc? Yes  Adequate lighting in your home to reduce risk of falls? Yes   ASSISTIVE DEVICES UTILIZED TO PREVENT FALLS:  Life alert? No  Use of a cane, walker or w/c? No  Grab bars in the bathroom? Yes  Shower chair or bench in shower? Yes  Elevated toilet seat or a handicapped toilet? Yes  TIMED UP AND GO:  Was the test performed? No . Audio Visit   Cognitive Function:        07/17/2022    1:42 PM  6CIT Screen  What Year? 0 points  What month? 0 points  What time? 0 points  Count back from 20 0 points  Months in reverse 0 points  Repeat phrase 0 points  Total Score 0 points    Immunizations Immunization History  Administered Date(s) Administered   Fluad  Quad(high Dose 65+) 05/12/2021, 04/11/2022   Influenza-Unspecified 07/31/2016   PFIZER(Purple Top)SARS-COV-2 Vaccination 08/08/2019, 09/02/2019, 07/30/2020, 12/09/2020   Pfizer Covid-19 Vaccine Bivalent Booster 19yr & up 05/12/2021   Pneumococcal Conjugate-13 07/31/2016   Pneumococcal Polysaccharide-23 03/15/2010   Tdap 03/08/2020    TDAP status: Up to date  Flu Vaccine status: Up to date  Pneumococcal vaccine status: Up to date  Covid-19 vaccine status: Completed vaccines  Qualifies for Shingles Vaccine? Yes   Zostavax completed No   Shingrix Completed?: No.    Education has been provided regarding the importance of this vaccine. Patient has been advised to call insurance company to determine out of pocket expense if they have not yet received this vaccine. Advised may also receive vaccine at local pharmacy or Health Dept. Verbalized acceptance and understanding.  Screening Tests Health Maintenance  Topic Date Due   COVID-19 Vaccine (6 - 2023-24 season) 08/02/2022 (Originally 03/31/2022)   Zoster Vaccines- Shingrix (1 of 2) 10/16/2022 (Originally 05/02/1951)   Medicare Annual Wellness (AWV)  07/18/2023   DTaP/Tdap/Td (2 - Td or Tdap) 03/08/2030   Pneumonia Vaccine 88 Years old  Completed   INFLUENZA VACCINE  Completed   HPV VACCINES  Aged Out    Health Maintenance  There are no preventive care reminders to display for this patient.   Colorectal cancer screening: No longer required.   Lung Cancer Screening: (Low Dose CT Chest recommended if Age 86-80years, 30 pack-year currently smoking OR have quit w/in 15years.)  qualify.     Additional Screening:  Hepatitis C Screening: does not qualify; Completed   Vision Screening: Recommended annual ophthalmology exams for early detection of glaucoma and other disorders of the eye. Is the patient up to date with their annual eye exam?  Yes  Who is the provider or what is the name of the office in which the patient attends  annual eye exams? Dr DBing PlumeIf pt is not established with a provider, would they like to be referred to a provider to establish care? No .   Dental Screening: Recommended annual dental exams for proper oral hygiene  Community Resource Referral / Chronic Care Management:  CRR required this visit?  No   CCM required this visit?  No      Plan:     I have personally reviewed and noted the following in the patient's chart:   Medical and social history Use of alcohol, tobacco or illicit drugs  Current medications and supplements including opioid prescriptions. Patient is not currently taking opioid prescriptions. Functional ability and status Nutritional status Physical activity Advanced directives List of other physicians Hospitalizations, surgeries, and ER visits in previous 12 months Vitals Screenings to include cognitive, depression, and falls Referrals and appointments  In addition, I have reviewed and discussed with patient certain preventive protocols, quality metrics, and best practice recommendations. A written personalized care plan for preventive services as well as general preventive health recommendations were provided to patient.     BCriselda Peaches LPN  07/17/2022   Nurse Notes: None

## 2022-07-17 NOTE — Patient Instructions (Addendum)
Mr. Ronald Case , Thank you for taking time to come for your Medicare Wellness Visit. I appreciate your ongoing commitment to your health goals. Please review the following plan we discussed and let me know if I can assist you in the future.   These are the goals we discussed:  Goals       Exercise 3x per week (30 min per time)      Patient Stated (pt-stated)      I will continue to play golf       Patient Stated      07/11/2021, wants to see why having hematuria        This is a list of the screening recommended for you and due dates:  Health Maintenance  Topic Date Due   COVID-19 Vaccine (6 - 2023-24 season) 08/02/2022*   Zoster (Shingles) Vaccine (1 of 2) 10/16/2022*   Medicare Annual Wellness Visit  07/18/2023   DTaP/Tdap/Td vaccine (2 - Td or Tdap) 03/08/2030   Pneumonia Vaccine  Completed   Flu Shot  Completed   HPV Vaccine  Aged Out  *Topic was postponed. The date shown is not the original due date.    Advanced directives: Please bring a copy of your health care power of attorney and living will to the office to be added to your chart at your convenience.   Conditions/risks identified: None  Next appointment: Follow up in one year for your annual wellness visit.    Preventive Care 86 Years and Older, Male  Preventive care refers to lifestyle choices and visits with your health care provider that can promote health and wellness. What does preventive care include? A yearly physical exam. This is also called an annual well check. Dental exams once or twice a year. Routine eye exams. Ask your health care provider how often you should have your eyes checked. Personal lifestyle choices, including: Daily care of your teeth and gums. Regular physical activity. Eating a healthy diet. Avoiding tobacco and drug use. Limiting alcohol use. Practicing safe sex. Taking low doses of aspirin every day. Taking vitamin and mineral supplements as recommended by your health care  provider. What happens during an annual well check? The services and screenings done by your health care provider during your annual well check will depend on your age, overall health, lifestyle risk factors, and family history of disease. Counseling  Your health care provider may ask you questions about your: Alcohol use. Tobacco use. Drug use. Emotional well-being. Home and relationship well-being. Sexual activity. Eating habits. History of falls. Memory and ability to understand (cognition). Work and work Statistician. Screening  You may have the following tests or measurements: Height, weight, and BMI. Blood pressure. Lipid and cholesterol levels. These may be checked every 5 years, or more frequently if you are over 59 years old. Skin check. Lung cancer screening. You may have this screening every year starting at age 86 if you have a 30-pack-year history of smoking and currently smoke or have quit within the past 15 years. Fecal occult blood test (FOBT) of the stool. You may have this test every year starting at age 86. Flexible sigmoidoscopy or colonoscopy. You may have a sigmoidoscopy every 5 years or a colonoscopy every 10 years starting at age 86. Prostate cancer screening. Recommendations will vary depending on your family history and other risks. Hepatitis C blood test. Hepatitis B blood test. Sexually transmitted disease (STD) testing. Diabetes screening. This is done by checking your blood sugar (glucose) after you  have not eaten for a while (fasting). You may have this done every 1-3 years. Abdominal aortic aneurysm (AAA) screening. You may need this if you are a current or former smoker. Osteoporosis. You may be screened starting at age 86 if you are at high risk. Talk with your health care provider about your test results, treatment options, and if necessary, the need for more tests. Vaccines  Your health care provider may recommend certain vaccines, such  as: Influenza vaccine. This is recommended every year. Tetanus, diphtheria, and acellular pertussis (Tdap, Td) vaccine. You may need a Td booster every 10 years. Zoster vaccine. You may need this after age 86. Pneumococcal 13-valent conjugate (PCV13) vaccine. One dose is recommended after age 86. Pneumococcal polysaccharide (PPSV23) vaccine. One dose is recommended after age 86. Talk to your health care provider about which screenings and vaccines you need and how often you need them. This information is not intended to replace advice given to you by your health care provider. Make sure you discuss any questions you have with your health care provider. Document Released: 08/13/2015 Document Revised: 04/05/2016 Document Reviewed: 05/18/2015 Elsevier Interactive Patient Education  2017 [place] Prevention in the Home Falls can cause injuries. They can happen to people of all ages. There are many things you can do to make your home safe and to help prevent falls. What can I do on the outside of my home? Regularly fix the edges of walkways and driveways and fix any cracks. Remove anything that might make you trip as you walk through a door, such as a raised step or threshold. Trim any bushes or trees on the path to your home. Use bright outdoor lighting. Clear any walking paths of anything that might make someone trip, such as rocks or tools. Regularly check to see if handrails are loose or broken. Make sure that both sides of any steps have handrails. Any raised decks and porches should have guardrails on the edges. Have any leaves, snow, or ice cleared regularly. Use sand or salt on walking paths during winter. Clean up any spills in your garage right away. This includes oil or grease spills. What can I do in the bathroom? Use night lights. Install grab bars by the toilet and in the tub and shower. Do not use towel bars as grab bars. Use non-skid mats or decals in the tub or  shower. If you need to sit down in the shower, use a plastic, non-slip stool. Keep the floor dry. Clean up any water that spills on the floor as soon as it happens. Remove soap buildup in the tub or shower regularly. Attach bath mats securely with double-sided non-slip rug tape. Do not have throw rugs and other things on the floor that can make you trip. What can I do in the bedroom? Use night lights. Make sure that you have a light by your bed that is easy to reach. Do not use any sheets or blankets that are too big for your bed. They should not hang down onto the floor. Have a firm chair that has side arms. You can use this for support while you get dressed. Do not have throw rugs and other things on the floor that can make you trip. What can I do in the kitchen? Clean up any spills right away. Avoid walking on wet floors. Keep items that you use a lot in easy-to-reach places. If you need to reach something above you, use a strong step  stool that has a grab bar. Keep electrical cords out of the way. Do not use floor polish or wax that makes floors slippery. If you must use wax, use non-skid floor wax. Do not have throw rugs and other things on the floor that can make you trip. What can I do with my stairs? Do not leave any items on the stairs. Make sure that there are handrails on both sides of the stairs and use them. Fix handrails that are broken or loose. Make sure that handrails are as long as the stairways. Check any carpeting to make sure that it is firmly attached to the stairs. Fix any carpet that is loose or worn. Avoid having throw rugs at the top or bottom of the stairs. If you do have throw rugs, attach them to the floor with carpet tape. Make sure that you have a light switch at the top of the stairs and the bottom of the stairs. If you do not have them, ask someone to add them for you. What else can I do to help prevent falls? Wear shoes that: Do not have high heels. Have  rubber bottoms. Are comfortable and fit you well. Are closed at the toe. Do not wear sandals. If you use a stepladder: Make sure that it is fully opened. Do not climb a closed stepladder. Make sure that both sides of the stepladder are locked into place. Ask someone to hold it for you, if possible. Clearly mark and make sure that you can see: Any grab bars or handrails. First and last steps. Where the edge of each step is. Use tools that help you move around (mobility aids) if they are needed. These include: Canes. Walkers. Scooters. Crutches. Turn on the lights when you go into a dark area. Replace any light bulbs as soon as they burn out. Set up your furniture so you have a clear path. Avoid moving your furniture around. If any of your floors are uneven, fix them. If there are any pets around you, be aware of where they are. Review your medicines with your doctor. Some medicines can make you feel dizzy. This can increase your chance of falling. Ask your doctor what other things that you can do to help prevent falls. This information is not intended to replace advice given to you by your health care provider. Make sure you discuss any questions you have with your health care provider. Document Released: 05/13/2009 Document Revised: 12/23/2015 Document Reviewed: 08/21/2014 Elsevier Interactive Patient Education  2017 [employer].

## 2022-09-21 ENCOUNTER — Telehealth: Payer: Self-pay

## 2022-09-21 NOTE — Patient Instructions (Signed)
Visit Information  Thank you for taking time to visit with me today. Please don't hesitate to contact me if I can be of assistance to you.   Following are the goals we discussed today:   Goals Addressed             This Visit's Progress    COMPLETED: Care Coordination Activities-No follow up required       Interventions Today    Flowsheet Row Most Recent Value  General Interventions   General Interventions Discussed/Reviewed General Interventions Discussed, Doctor Visits  Doctor Visits Discussed/Reviewed Doctor Visits Discussed, Annual Wellness Visits  Education Interventions   Education Provided Provided Education  Provided Verbal Education On Other  [Annual visits]               If you are experiencing a Mental Health or [place] or need someone to talk to, please call the Suicide and Crisis Lifeline: 988   Patient verbalizes understanding of instructions and care plan provided today and agrees to view in [place]. Active MyChart status and patient understanding of how to access instructions and care plan via MyChart confirmed with patient.     No further follow up required: decline  Jone Baseman, RN, MSN [facility] Care Management Coordinator Direct Line [phone]

## 2022-09-21 NOTE — Patient Outreach (Signed)
  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 I spoke with  Worthy Flank, MD by phone today.  What matters to the patients health and wellness today?  none    Goals Addressed             This Visit's Progress    COMPLETED: Care Coordination Activities-No follow up required       Interventions Today    Flowsheet Row Most Recent Value  General Interventions   General Interventions Discussed/Reviewed General Interventions Discussed, Doctor Visits  Doctor Visits Discussed/Reviewed Doctor Visits Discussed, Annual Wellness Visits  Education Interventions   Education Provided Provided Education  Provided Verbal Education On Other  [Annual visits]              SDOH assessments and interventions completed:  Yes  SDOH Interventions Today    Flowsheet Row Most Recent Value  SDOH Interventions   Food Insecurity Interventions Intervention Not Indicated  Housing Interventions Intervention Not Indicated        Care Coordination Interventions:  Yes, provided   Follow up plan: No further intervention required.   Encounter Outcome:  Pt. Visit Completed   Jone Baseman, RN, MSN [facility] Care Management Coordinator Direct Line [phone]

## 2022-11-25 ENCOUNTER — Other Ambulatory Visit: Payer: Self-pay | Admitting: Family Medicine

## 2023-01-22 ENCOUNTER — Other Ambulatory Visit: Payer: Self-pay | Admitting: Family Medicine

## 2023-01-22 NOTE — Addendum Note (Signed)
Addended by: Christy Sartorius on: 01/22/2023 01:25 PM   Modules accepted: Orders

## 2023-01-22 NOTE — Telephone Encounter (Signed)
Prescription Request  01/22/2023  LOV: 03/10/2022  What is the name of the medication or equipment? clopidogrel (PLAVIX) 75 MG tablet   Have you contacted your pharmacy to request a refill? Yes   Which pharmacy would you like this sent to?  [id number] [place], [place] - [address] [place] [address] Phone: [phone] Fax: [phone]    Patient notified that their request is being sent to the clinical staff for review and that they should receive a response within 2 business days.   Please advise at Mobile [phone] (mobile)

## 2023-01-23 MED ORDER — CLOPIDOGREL BISULFATE 75 MG PO TABS: 75.0000 mg | ORAL_TABLET | Freq: Every day | ORAL | 0 refills | Status: DC

## 2023-01-23 NOTE — Addendum Note (Signed)
Addended by: Johnella Moloney on: 01/23/2023 09:29 AM   Modules accepted: Orders

## 2023-01-23 NOTE — Telephone Encounter (Signed)
Rx done. 

## 2023-03-01 ENCOUNTER — Other Ambulatory Visit: Payer: Self-pay | Admitting: Family Medicine

## 2023-03-02 ENCOUNTER — Other Ambulatory Visit: Payer: Self-pay | Admitting: Family Medicine

## 2023-03-12 ENCOUNTER — Ambulatory Visit (INDEPENDENT_AMBULATORY_CARE_PROVIDER_SITE_OTHER): Payer: Medicare HMO | Admitting: Family Medicine

## 2023-03-12 ENCOUNTER — Encounter: Payer: Self-pay | Admitting: Family Medicine

## 2023-03-12 VITALS — BP 150/84 | HR 63 | Temp 98.0°F | Ht 69.0 in | Wt 193.3 lb

## 2023-03-12 DIAGNOSIS — Z0000 Encounter for general adult medical examination without abnormal findings: Secondary | ICD-10-CM

## 2023-03-12 DIAGNOSIS — I10 Essential (primary) hypertension: Secondary | ICD-10-CM | POA: Diagnosis not present

## 2023-03-12 DIAGNOSIS — E785 Hyperlipidemia, unspecified: Secondary | ICD-10-CM | POA: Diagnosis not present

## 2023-03-12 DIAGNOSIS — N183 Chronic kidney disease, stage 3 unspecified: Secondary | ICD-10-CM | POA: Insufficient documentation

## 2023-03-12 DIAGNOSIS — N1831 Chronic kidney disease, stage 3a: Secondary | ICD-10-CM | POA: Diagnosis not present

## 2023-03-12 LAB — COMPREHENSIVE METABOLIC PANEL
ALT: 15 U/L (ref 0–53)
AST: 13 U/L (ref 0–37)
Albumin: 4 g/dL (ref 3.5–5.2)
Alkaline Phosphatase: 94 U/L (ref 39–117)
BUN: 28 mg/dL — ABNORMAL HIGH (ref 6–23)
CO2: 25 mEq/L (ref 19–32)
Calcium: 9 mg/dL (ref 8.4–10.5)
Chloride: 105 mEq/L (ref 96–112)
Creatinine, Ser: 1.47 mg/dL (ref 0.40–1.50)
GFR: 42.22 mL/min — ABNORMAL LOW (ref >60.00)
Glucose, Bld: 99 mg/dL (ref 70–99)
Potassium: 4.2 mEq/L (ref 3.5–5.1)
Sodium: 137 mEq/L (ref 135–145)
Total Bilirubin: 0.8 mg/dL (ref 0.2–1.2)
Total Protein: 6 g/dL (ref 6.0–8.3)

## 2023-03-12 LAB — CBC WITH DIFFERENTIAL/PLATELET
Basophils Absolute: 0 10*3/uL (ref 0.0–0.1)
Basophils Relative: 0.8 % (ref 0.0–3.0)
Eosinophils Absolute: 0.1 10*3/uL (ref 0.0–0.7)
Eosinophils Relative: 1.8 % (ref 0.0–5.0)
HCT: 41.7 % (ref 39.0–52.0)
Hemoglobin: 13.8 g/dL (ref 13.0–17.0)
Lymphocytes Relative: 32 % (ref 12.0–46.0)
Lymphs Abs: 1.9 10*3/uL (ref 0.7–4.0)
MCHC: 33.2 g/dL (ref 30.0–36.0)
MCV: 95.9 fl (ref 78.0–100.0)
Monocytes Absolute: 0.5 10*3/uL (ref 0.1–1.0)
Monocytes Relative: 8.5 % (ref 3.0–12.0)
Neutro Abs: 3.3 10*3/uL (ref 1.4–7.7)
Neutrophils Relative %: 56.9 % (ref 43.0–77.0)
Platelets: 197 10*3/uL (ref 150.0–400.0)
RBC: 4.35 Mil/uL (ref 4.22–5.81)
RDW: 13.6 % (ref 11.5–15.5)
WBC: 5.8 10*3/uL (ref 4.0–10.5)

## 2023-03-12 LAB — LDL CHOLESTEROL, DIRECT: Direct LDL: 56 mg/dL

## 2023-03-12 LAB — LIPID PANEL
Cholesterol: 254 mg/dL — ABNORMAL HIGH (ref 0–200)
HDL: 38.3 mg/dL — ABNORMAL LOW (ref >39.00)
NonHDL: 215.42
Total CHOL/HDL Ratio: 7
Triglycerides: 324 mg/dL — ABNORMAL HIGH (ref 0.0–149.0)
VLDL: 64.8 mg/dL — ABNORMAL HIGH (ref 0.0–40.0)

## 2023-03-12 LAB — TSH: TSH: 2.37 u[IU]/mL (ref 0.35–5.50)

## 2023-03-12 MED ORDER — ATORVASTATIN CALCIUM 40 MG PO TABS: 40.0000 mg | ORAL_TABLET | Freq: Every day | ORAL | 3 refills | Status: AC

## 2023-03-12 MED ORDER — TAMSULOSIN HCL 0.4 MG PO CAPS: 0.4000 mg | ORAL_CAPSULE | Freq: Every day | ORAL | 3 refills | Status: DC

## 2023-03-12 MED ORDER — LOSARTAN POTASSIUM-HCTZ 100-12.5 MG PO TABS: 1.0000 | ORAL_TABLET | Freq: Every day | ORAL | 3 refills | Status: DC

## 2023-03-12 MED ORDER — METOPROLOL SUCCINATE ER 25 MG PO TB24: 25.0000 mg | ORAL_TABLET | Freq: Every day | ORAL | 3 refills | Status: DC

## 2023-03-12 MED ORDER — AMLODIPINE BESYLATE 5 MG PO TABS: 5.0000 mg | ORAL_TABLET | Freq: Every day | ORAL | 3 refills | Status: DC

## 2023-03-12 MED ORDER — PRAMIPEXOLE DIHYDROCHLORIDE 0.25 MG PO TABS: ORAL_TABLET | ORAL | 3 refills | Status: DC

## 2023-03-12 MED ORDER — CLOPIDOGREL BISULFATE 75 MG PO TABS: 75.0000 mg | ORAL_TABLET | Freq: Every day | ORAL | 3 refills | Status: DC

## 2023-03-12 NOTE — Progress Notes (Signed)
Established Patient Office Visit  Subjective   Patient ID: Susa Raring, MD, male    DOB: [birth year]  Age: 89 y.o. MRN: [id number]  Chief Complaint  Patient presents with   Annual Exam    Pt reports he will have cataract surgery soon.     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  He has history of CAD, hypertension, Mnire's disease, BPH, restless legs, hyperlipidemia, and chronic kidney disease with baseline creatinine around 50. His medications are reviewed and include losartan HCTZ, Flomax, Mirapex, Plavix, metoprolol succinate, atorvastatin.  Denies any recent chest pains.  He had stents placed in his heart back at age 68 and again about 15 years later around age 18.  Stays quite active with things like yard work.  Planning to have cataract surgery soon.  Getting ready to turn 89.  He and his wife are planning a trip to [place] next month.  Health maintenance reviewed  -He gets annual flu vaccine in the fall -Pneumonia vaccines complete -Tetanus due 2031 -No history of Shingrix vaccine.  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  Quit smoking 1967.  Occasional wine use.  He is married.  Was in the service prior to private practice  Family history-his father died around age 38 of stroke.  Mom died of subdural hematoma complications in her 56s.  He has 1 brother with no active medical problems.  He has 3 half siblings 1 with Parkinson's and a sister with breast cancer and CAD  Past Medical History:  Diagnosis Date   Arthritis    hip.knees   Coronary artery disease    Dysrhythmia    History of kidney stones    Hypertension    Kidney stones    Meniere's disease    Past Surgical History:  Procedure Laterality Date   BACK SURGERY     L3-L4   CARDIAC CATHETERIZATION  2012   3 stents   CORONARY ANGIOPLASTY WITH STENT PLACEMENT  1996   3  stents   CYSTOSCOPY/URETEROSCOPY/HOLMIUM LASER/STENT PLACEMENT Right 09/13/2021   Procedure: RIGHT URETEROSCOPY/HOLMIUM LASER/STENT PLACEMENT;  Surgeon: Despina Arias, MD;  Location: [facility];  Service: Urology;  Laterality: Right;   INGUINAL HERNIA REPAIR Left    INGUINAL HERNIA REPAIR Right    LAMINECTOMY     L4-L5    reports that he quit smoking about 57 years ago. His smoking use included cigarettes. He started smoking about 71 years ago. He has a 28 pack-year smoking history. He has never used smokeless tobacco. He reports current alcohol use of about 14.0 standard drinks of alcohol per week. He reports that he does not use drugs. family history includes Other (age of onset: 64) in his mother; Stroke (age of onset: 102) in his father. No Known Allergies  Review of Systems  Constitutional:  Negative for chills, fever, malaise/fatigue and weight loss.  HENT:  Negative for hearing loss.   Eyes:  Negative for blurred vision and double vision.  Respiratory:  Negative for cough and shortness of breath.   Cardiovascular:  Negative for chest pain, palpitations and leg swelling.  Gastrointestinal:  Negative for abdominal pain, blood in stool, constipation and diarrhea.  Genitourinary:  Negative for dysuria.  Skin:  Negative for rash.  Neurological:  Negative for dizziness, speech change, seizures, loss of consciousness and headaches.  Psychiatric/Behavioral:  Negative for  depression.       Objective:     BP (!) 150/84 (BP Location: Left Arm, Patient Position: Sitting, Cuff Size: Normal)   Pulse 63   Temp 98 F (36.7 C) (Oral)   Ht 5\' 9"  (1.753 m)   Wt 193 lb 4.8 oz (87.7 kg)   SpO2 96%   BMI 28.55 kg/m  BP Readings from Last 3 Encounters:  03/12/23 (!) 150/84  03/10/22 (!) 164/80  09/13/21 (!) 150/84   Wt Readings from Last 3 Encounters:  03/12/23 193 lb 4.8 oz (87.7 kg)  07/17/22 187 lb (84.8 kg)  03/10/22 187 lb 14.4 oz (85.2 kg)      Physical Exam Vitals reviewed.   Constitutional:      General: He is not in acute distress.    Appearance: He is well-developed.  HENT:     Head: Normocephalic and atraumatic.     Right Ear: External ear normal.     Left Ear: External ear normal.  Eyes:     Conjunctiva/sclera: Conjunctivae normal.     Pupils: Pupils are equal, round, and reactive to light.  Neck:     Thyroid: No thyromegaly.  Cardiovascular:     Rate and Rhythm: Normal rate and regular rhythm.     Heart sounds: Normal heart sounds.  Pulmonary:     Effort: No respiratory distress.     Breath sounds: No wheezing or rales.  Abdominal:     General: Bowel sounds are normal. There is no distension.     Palpations: Abdomen is soft. There is no mass.     Tenderness: There is no abdominal tenderness. There is no guarding or rebound.  Musculoskeletal:     Cervical back: Normal range of motion and neck supple.     Right lower leg: No edema.     Left lower leg: No edema.  Lymphadenopathy:     Cervical: No cervical adenopathy.  Skin:    Findings: No rash.  Neurological:     Mental Status: He is alert and oriented to person, place, and time.     Cranial Nerves: No cranial nerve deficit.      No results found for any visits on 03/12/23.    The ASCVD Risk score (Arnett DK, et al., 2019) failed to calculate for the following reasons:   The 2019 ASCVD risk score is only valid for ages 57 to 23    Assessment & Plan:   Physical exam.  He has history of hypertension which is poorly controlled at this time.  Has had history of frequent elevated readings in the past.  Has had several home readings up as high as 150s systolic with diastolics generally fairly well-controlled.  -Recommend addition of amlodipine 5 mg daily to his current regimen -Handout on DASH diet given -Set up follow-up in about 6 weeks to reassess -Refill all medications for 1 year -Recommend annual flu vaccine this fall -Consider Shingrix vaccine and he will consider -Consider  RSV vaccine -Pneumonia vaccines complete -Recheck lipids.  He has been intolerant of higher doses of statin in the past.  Consider addition of Zetia if LDL not below 55.  Return in about 6 weeks (around 04/23/2023).    Evelena Peat, MD

## 2023-03-16 ENCOUNTER — Other Ambulatory Visit: Payer: Self-pay

## 2023-03-16 ENCOUNTER — Emergency Department (HOSPITAL_BASED_OUTPATIENT_CLINIC_OR_DEPARTMENT_OTHER): Payer: Medicare HMO

## 2023-03-16 ENCOUNTER — Encounter (HOSPITAL_BASED_OUTPATIENT_CLINIC_OR_DEPARTMENT_OTHER): Payer: Self-pay

## 2023-03-16 ENCOUNTER — Emergency Department (HOSPITAL_BASED_OUTPATIENT_CLINIC_OR_DEPARTMENT_OTHER)
Admission: EM | Admit: 2023-03-16 | Discharge: 2023-03-16 | Disposition: A | Discharge status: Home / Self Care | Payer: Medicare HMO | Attending: Emergency Medicine | Admitting: Emergency Medicine

## 2023-03-16 DIAGNOSIS — Y92096 Garden or yard of other non-institutional residence as the place of occurrence of the external cause: Secondary | ICD-10-CM | POA: Diagnosis not present

## 2023-03-16 DIAGNOSIS — S91332A Puncture wound without foreign body, left foot, initial encounter: Secondary | ICD-10-CM | POA: Insufficient documentation

## 2023-03-16 DIAGNOSIS — M7732 Calcaneal spur, left foot: Secondary | ICD-10-CM | POA: Diagnosis not present

## 2023-03-16 DIAGNOSIS — M7989 Other specified soft tissue disorders: Secondary | ICD-10-CM | POA: Diagnosis not present

## 2023-03-16 DIAGNOSIS — M85872 Other specified disorders of bone density and structure, left ankle and foot: Secondary | ICD-10-CM | POA: Diagnosis not present

## 2023-03-16 DIAGNOSIS — I2510 Atherosclerotic heart disease of native coronary artery without angina pectoris: Secondary | ICD-10-CM | POA: Insufficient documentation

## 2023-03-16 DIAGNOSIS — Y9389 Activity, other specified: Secondary | ICD-10-CM | POA: Insufficient documentation

## 2023-03-16 DIAGNOSIS — I10 Essential (primary) hypertension: Secondary | ICD-10-CM | POA: Insufficient documentation

## 2023-03-16 DIAGNOSIS — W60XXXA Contact with nonvenomous plant thorns and spines and sharp leaves, initial encounter: Secondary | ICD-10-CM | POA: Diagnosis not present

## 2023-03-16 DIAGNOSIS — M79672 Pain in left foot: Secondary | ICD-10-CM | POA: Diagnosis not present

## 2023-03-16 MED ORDER — CIPROFLOXACIN HCL 500 MG PO TABS: 500.0000 mg | ORAL_TABLET | Freq: Two times a day (BID) | ORAL | 0 refills | Status: AC

## 2023-03-16 NOTE — ED Provider Notes (Signed)
Emergency Department Provider Note   I have reviewed the triage vital signs and the nursing notes.   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  Patient was working in the garden yesterday when a thorn punctured through the sole of his shoe and into the bottom of his foot.  He states he pulled the shoe off and the thorn appeared intact in the sole of the shoe.  There was some minor bleeding which was stopped with direct pressure.  Has had some soreness with development of erythema and mild swelling in the past 24 hours.  No fevers. Tetanus updated last year.    Past Medical History:  Diagnosis Date   Arthritis    hip.knees   Coronary artery disease    Dysrhythmia    History of kidney stones    Hypertension    Kidney stones    Meniere's disease     Review of Systems  Constitutional: No fever/chills Cardiovascular: Denies chest pain. Respiratory: Denies shortness of breath. Gastrointestinal: No abdominal pain.  No nausea, no vomiting.  No diarrhea.  No constipation. Genitourinary: Negative for dysuria. Musculoskeletal: Negative for back pain. Pain to the bottom of the left foot with swelling.  Skin: Redness to the bottom of the left foot.  Neurological: Negative for headaches.   ____________________________________________   PHYSICAL EXAM:  VITAL SIGNS: ED Triage Vitals  Encounter Vitals Group     BP 03/16/23 1040 (!) 156/74     Pulse Rate 03/16/23 1040 65     Resp 03/16/23 1040 16     Temp 03/16/23 1040 98.7 F (37.1 C)     Temp Source 03/16/23 1040 Oral     SpO2 03/16/23 1040 96 %     Weight 03/16/23 1041 191 lb 12.8 oz (87 kg)     Height 03/16/23 1041 5\' 9"  (1.753 m)   Constitutional: Alert and oriented. Well appearing and in no acute distress. Eyes: Conjunctivae are normal.  Head: Atraumatic. Nose: No  congestion/rhinnorhea. Mouth/Throat: Mucous membranes are moist.  Neck: No stridor.   Cardiovascular: Normal rate, regular rhythm. Good peripheral circulation. Grossly normal heart sounds.   Respiratory: Normal respiratory effort.  No retractions. Lungs CTAB. Gastrointestinal: Soft and nontender. No distention.  Musculoskeletal: Mild edema noted to the plantar surface of the left foot with faint erythema.  No abscess, necrotic tissue, blistering.  Neurologic:  Normal speech and language.  Skin:  Skin is warm, dry and intact. Puncture wound noted and is hemostatic.   ____________________________________________  RADIOLOGY  No results found.  ____________________________________________   PROCEDURES  Procedure(s) performed:   Procedures   ____________________________________________   INITIAL IMPRESSION / ASSESSMENT AND PLAN / ED COURSE  Pertinent labs & imaging results that were available during my care of the patient were reviewed by me and considered in my medical decision making (see chart for details).   This patient is Presenting for Evaluation of puncture wound, which does require a range of treatment options, and is a complaint that involves a low risk of morbidity and mortality.  The Differential Diagnoses include bacterial infection, retained foreign body, sporotrichosis, etc.  Radiologic Tests Ordered, included foot XR. I independently interpreted the images and agree with radiology interpretation.   Medical Decision Making: Summary:  Patient presents to the emergency department with foot pain and swelling with erythema.  Considered multiple diagnoses as above.  Patient has no obvious signs of cutaneous sporotrichosis at this time but injury was from a thorn. Also came through the sole of the shoe.   Reevaluation with update and discussion with   ***Considered admission***  Patient's presentation is most consistent with acute, uncomplicated illness.    Disposition:   ____________________________________________  FINAL CLINICAL IMPRESSION(S) / ED DIAGNOSES  Final diagnoses:  None     NEW OUTPATIENT MEDICATIONS STARTED DURING THIS VISIT:  New Prescriptions   No medications on file    Note:  This document was prepared using Dragon voice recognition software and may include unintentional dictation errors.  Alona Bene, MD, [facility]

## 2023-03-16 NOTE — ED Triage Notes (Signed)
The patient was trimming a bush with large thorns on Wednesday. He stated he stepped on a thorn that went through his shoe into his left foot. They redness and swelling started Thursday morning. No fever.

## 2023-03-16 NOTE — Discharge Instructions (Signed)
You were seen in the emerged part today with a puncture wound through your shoe.  Unfortunately we will need to start you on some fairly strong antibiotics.  As we discussed, this antibiotic does have several side effects which will be listed on the paperwork from the pharmacy.  Please follow closely with your primary care doctor.  If you develop new lesions or sores you may require additional treatment.

## 2023-03-23 ENCOUNTER — Telehealth: Payer: Self-pay | Admitting: Family Medicine

## 2023-03-23 NOTE — Telephone Encounter (Signed)
Patient has been scheduled to see PCP on 08/26

## 2023-03-23 NOTE — Telephone Encounter (Signed)
Puncture wound to foot, 03/14/23, finished course of antibiotics today. Foot still sore when he walks on it. Asking if he needs more antibiotics or to be seen.

## 2023-03-26 ENCOUNTER — Ambulatory Visit (INDEPENDENT_AMBULATORY_CARE_PROVIDER_SITE_OTHER): Payer: Medicare HMO | Admitting: Family Medicine

## 2023-03-26 ENCOUNTER — Ambulatory Visit: Payer: Medicare HMO | Admitting: Internal Medicine

## 2023-03-26 ENCOUNTER — Encounter: Payer: Self-pay | Admitting: Family Medicine

## 2023-03-26 VITALS — BP 132/70 | HR 70 | Temp 97.5°F | Ht 69.0 in | Wt 195.5 lb

## 2023-03-26 DIAGNOSIS — L03116 Cellulitis of left lower limb: Secondary | ICD-10-CM

## 2023-03-26 NOTE — Progress Notes (Signed)
Established Patient Office Visit  Subjective   Patient ID: Ronald Raring, MD, male    DOB: 07-08-1932  Age: 89 y.o. MRN: [id number]  Chief Complaint  Patient presents with   Wound Check    HPI   Recent ER visit for puncture wound left foot.  He had been working in his garden and had a puncture from a thorn that went up through the sole of his shoe into the bottom of his foot.  Had some very minor bleeding.  Increased soreness and swelling 24 hours before being seen.  Was also developing increased erythema.  Tetanus up-to-date.  X-ray in the ER showed some soft tissue swelling but no definite foreign body.  Patient started on Cipro 500 mg twice daily for 7 days.  He relates his swelling and redness have gone down.  Also less erythema.  Still has little bit of very mild soreness but overall much improved.  He is getting ready to travel out of country to [place] and wanted to make sure this was resolving before leaving  He also reports blood pressure is much improved after recent addition of amlodipine 5 mg daily.  Home blood pressures low 100 systolic and about 68 diastolic.  Tolerating well without side effect.  Past Medical History:  Diagnosis Date   Arthritis    hip.knees   Coronary artery disease    Dysrhythmia    History of kidney stones    Hypertension    Kidney stones    Meniere's disease    Past Surgical History:  Procedure Laterality Date   BACK SURGERY     L3-L4   CARDIAC CATHETERIZATION  2012   3 stents   CORONARY ANGIOPLASTY WITH STENT PLACEMENT  1996   3 stents   CYSTOSCOPY/URETEROSCOPY/HOLMIUM LASER/STENT PLACEMENT Right 09/13/2021   Procedure: RIGHT URETEROSCOPY/HOLMIUM LASER/STENT PLACEMENT;  Surgeon: Despina Arias, MD;  Location: [facility];  Service: Urology;  Laterality: Right;   INGUINAL HERNIA REPAIR Left    INGUINAL HERNIA REPAIR Right    LAMINECTOMY     L4-L5    reports that he quit smoking about 57 years ago. His smoking use included cigarettes.  He started smoking about 71 years ago. He has a 28 pack-year smoking history. He has never used smokeless tobacco. He reports current alcohol use of about 14.0 standard drinks of alcohol per week. He reports that he does not use drugs. family history includes Other (age of onset: 8) in his mother; Stroke (age of onset: 51) in his father. No Known Allergies  Review of Systems  Constitutional:  Negative for chills and fever.      Objective:     BP 132/70 (BP Location: Left Arm, Patient Position: Sitting, Cuff Size: Normal)   Pulse 70   Temp (!) 97.5 F (36.4 C) (Oral)   Ht 5\' 9"  (1.753 m)   Wt 195 lb 8 oz (88.7 kg)   SpO2 98%   BMI 28.87 kg/m  BP Readings from Last 3 Encounters:  03/26/23 132/70  03/16/23 (!) 156/74  03/12/23 (!) 150/84   Wt Readings from Last 3 Encounters:  03/26/23 195 lb 8 oz (88.7 kg)  03/16/23 191 lb 12.8 oz (87 kg)  03/12/23 193 lb 4.8 oz (87.7 kg)      Physical Exam Vitals reviewed.  Constitutional:      Appearance: Normal appearance.  Skin:    Comments: Left foot reveals very small punctate area at site of puncture.  No palpated foreign body  and no foreign body seen.  No significant swelling.  No warmth.  Nontender.  No significant erythema  Neurological:     Mental Status: He is alert.      No results found for any visits on 03/26/23.    The ASCVD Risk score (Arnett DK, et al., 2019) failed to calculate for the following reasons:   The 2019 ASCVD risk score is only valid for ages 44 to 34    Assessment & Plan:   Puncture wound left foot from thorn bush.  Patient had cellulitis which is improved following Cipro.  No evidence for persistent cellulitis at this time.  -Follow-up promptly for any redness, swelling, or increasing pain Evelena Peat, MD

## 2023-03-26 NOTE — Patient Instructions (Signed)
Follow up for any increased redness, swelling, or pain.

## 2023-04-20 DIAGNOSIS — H43823 Vitreomacular adhesion, bilateral: Secondary | ICD-10-CM | POA: Diagnosis not present

## 2023-04-20 DIAGNOSIS — H35033 Hypertensive retinopathy, bilateral: Secondary | ICD-10-CM | POA: Diagnosis not present

## 2023-04-20 DIAGNOSIS — H25813 Combined forms of age-related cataract, bilateral: Secondary | ICD-10-CM | POA: Diagnosis not present

## 2023-04-20 DIAGNOSIS — H2181 Floppy iris syndrome: Secondary | ICD-10-CM | POA: Diagnosis not present

## 2023-04-29 ENCOUNTER — Other Ambulatory Visit: Payer: Self-pay

## 2023-04-29 ENCOUNTER — Emergency Department (HOSPITAL_COMMUNITY): Payer: Medicare HMO

## 2023-04-29 ENCOUNTER — Observation Stay (HOSPITAL_COMMUNITY)
Admission: EM | Admit: 2023-04-29 | Discharge: 2023-04-30 | Disposition: A | Discharge status: Home / Self Care | Payer: Medicare HMO | Attending: Emergency Medicine | Admitting: Emergency Medicine

## 2023-04-29 DIAGNOSIS — I129 Hypertensive chronic kidney disease with stage 1 through stage 4 chronic kidney disease, or unspecified chronic kidney disease: Secondary | ICD-10-CM | POA: Diagnosis not present

## 2023-04-29 DIAGNOSIS — N400 Enlarged prostate without lower urinary tract symptoms: Secondary | ICD-10-CM | POA: Diagnosis not present

## 2023-04-29 DIAGNOSIS — Z79899 Other long term (current) drug therapy: Secondary | ICD-10-CM | POA: Insufficient documentation

## 2023-04-29 DIAGNOSIS — N179 Acute kidney failure, unspecified: Secondary | ICD-10-CM

## 2023-04-29 DIAGNOSIS — N1831 Chronic kidney disease, stage 3a: Secondary | ICD-10-CM | POA: Diagnosis not present

## 2023-04-29 DIAGNOSIS — R4020 Unspecified coma: Secondary | ICD-10-CM | POA: Diagnosis not present

## 2023-04-29 DIAGNOSIS — Z7902 Long term (current) use of antithrombotics/antiplatelets: Secondary | ICD-10-CM | POA: Diagnosis not present

## 2023-04-29 DIAGNOSIS — R918 Other nonspecific abnormal finding of lung field: Secondary | ICD-10-CM | POA: Diagnosis not present

## 2023-04-29 DIAGNOSIS — R079 Chest pain, unspecified: Secondary | ICD-10-CM | POA: Diagnosis not present

## 2023-04-29 DIAGNOSIS — I2510 Atherosclerotic heart disease of native coronary artery without angina pectoris: Secondary | ICD-10-CM

## 2023-04-29 DIAGNOSIS — R55 Syncope and collapse: Secondary | ICD-10-CM | POA: Diagnosis not present

## 2023-04-29 DIAGNOSIS — I10 Essential (primary) hypertension: Secondary | ICD-10-CM

## 2023-04-29 DIAGNOSIS — Z955 Presence of coronary angioplasty implant and graft: Secondary | ICD-10-CM | POA: Insufficient documentation

## 2023-04-29 DIAGNOSIS — E785 Hyperlipidemia, unspecified: Secondary | ICD-10-CM

## 2023-04-29 DIAGNOSIS — Z7982 Long term (current) use of aspirin: Secondary | ICD-10-CM | POA: Insufficient documentation

## 2023-04-29 DIAGNOSIS — G2581 Restless legs syndrome: Secondary | ICD-10-CM | POA: Diagnosis not present

## 2023-04-29 DIAGNOSIS — Z87891 Personal history of nicotine dependence: Secondary | ICD-10-CM | POA: Diagnosis not present

## 2023-04-29 LAB — CBC
HCT: 34.9 % — ABNORMAL LOW (ref 39.0–52.0)
Hemoglobin: 11.5 g/dL — ABNORMAL LOW (ref 13.0–17.0)
MCH: 31.3 pg (ref 26.0–34.0)
MCHC: 33 g/dL (ref 30.0–36.0)
MCV: 94.8 fL (ref 80.0–100.0)
Platelets: 188 10*3/uL (ref 150–400)
RBC: 3.68 MIL/uL — ABNORMAL LOW (ref 4.22–5.81)
RDW: 12.7 % (ref 11.5–15.5)
WBC: 6.1 10*3/uL (ref 4.0–10.5)
nRBC: 0 % (ref 0.0–0.2)

## 2023-04-29 LAB — BASIC METABOLIC PANEL
Anion gap: 11 (ref 5–15)
BUN: 33 mg/dL — ABNORMAL HIGH (ref 8–23)
CO2: 20 mmol/L — ABNORMAL LOW (ref 22–32)
Calcium: 8.5 mg/dL — ABNORMAL LOW (ref 8.9–10.3)
Chloride: 106 mmol/L (ref 98–111)
Creatinine, Ser: 1.93 mg/dL — ABNORMAL HIGH (ref 0.61–1.24)
GFR, Estimated: 33 mL/min — ABNORMAL LOW (ref >60)
Glucose, Bld: 136 mg/dL — ABNORMAL HIGH (ref 70–99)
Potassium: 4.2 mmol/L (ref 3.5–5.1)
Sodium: 137 mmol/L (ref 135–145)

## 2023-04-29 LAB — TROPONIN I (HIGH SENSITIVITY)
Troponin I (High Sensitivity): 13 ng/L (ref <18)
Troponin I (High Sensitivity): 13 ng/L (ref <18)

## 2023-04-29 MED ORDER — SODIUM CHLORIDE 0.9% FLUSH: 3.0000 mL | Freq: Two times a day (BID) | INTRAVENOUS | Status: DC

## 2023-04-29 MED ORDER — ACETAMINOPHEN 325 MG PO TABS: 650.0000 mg | ORAL_TABLET | Freq: Four times a day (QID) | ORAL | Status: DC | PRN

## 2023-04-29 MED ORDER — ATORVASTATIN CALCIUM 40 MG PO TABS
40.0000 mg | ORAL_TABLET | Freq: Every day | ORAL | Status: DC
  Filled 2023-04-29: qty 1

## 2023-04-29 MED ORDER — ACETAMINOPHEN 650 MG RE SUPP: 650.0000 mg | Freq: Four times a day (QID) | RECTAL | Status: DC | PRN

## 2023-04-29 MED ORDER — SODIUM CHLORIDE 0.9 % IV BOLUS
500.0000 mL | Freq: Once | INTRAVENOUS | Status: AC
  Administered 2023-04-29: 500 mL via INTRAVENOUS

## 2023-04-29 MED ORDER — POLYETHYLENE GLYCOL 3350 17 G PO PACK: 17.0000 g | PACK | Freq: Every day | ORAL | Status: DC | PRN

## 2023-04-29 MED ORDER — TAMSULOSIN HCL 0.4 MG PO CAPS
0.4000 mg | ORAL_CAPSULE | Freq: Every day | ORAL | Status: DC
  Administered 2023-04-30: 0.4 mg via ORAL
  Filled 2023-04-29: qty 1

## 2023-04-29 MED ORDER — CLOPIDOGREL BISULFATE 75 MG PO TABS
75.0000 mg | ORAL_TABLET | Freq: Every day | ORAL | Status: DC
  Administered 2023-04-30: 75 mg via ORAL
  Filled 2023-04-29: qty 1

## 2023-04-29 MED ORDER — PRAMIPEXOLE DIHYDROCHLORIDE 0.25 MG PO TABS
1.0000 mg | ORAL_TABLET | Freq: Every day | ORAL | Status: DC
  Administered 2023-04-29: 1 mg via ORAL
  Filled 2023-04-29: qty 1
  Filled 2023-04-29: qty 4

## 2023-04-29 MED ORDER — SODIUM CHLORIDE 0.9 % IV SOLN: INTRAVENOUS | Status: DC

## 2023-04-29 MED ORDER — ENOXAPARIN SODIUM 30 MG/0.3ML IJ SOSY
30.0000 mg | PREFILLED_SYRINGE | INTRAMUSCULAR | Status: DC
  Administered 2023-04-29: 30 mg via SUBCUTANEOUS
  Filled 2023-04-29: qty 0.3

## 2023-04-29 NOTE — Plan of Care (Signed)
  Problem: Education: Goal: Knowledge of condition and prescribed therapy will improve Outcome: Progressing   Problem: Cardiac: Goal: Will achieve and/or maintain adequate cardiac output Outcome: Progressing   Problem: Physical Regulation: Goal: Complications related to the disease process, condition or treatment will be avoided or minimized Outcome: Progressing   

## 2023-04-29 NOTE — H&P (Signed)
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  Patient was at church and stood for a song and felt lightheaded and then sat back down; family reports that he looked somewhat gray and then he collapsed while sitting in the pew but did not fully pass out.  He did not notice any significant prodrome.  Reports that he may have had around 10 minutes of right-sided arm numbness but that has resolved.  Did report he has had episode of syncope in the past.  Reports chronic lower extremity edema.  States he was started on new blood pressure medicine recently due to blood pressure in the 150s systolic.  Denies fever, chills, chest pain, shortness of breath, abdominal pain, constipation, diarrhea, nausea, vomiting.  ED Course: Vital signs in the ED notable for blood pressure initially in the high 70s systolic but improved with 500 cc bolus to 100s to 130s systolic.  Lab workup included BMP which showed bicarb 28, BUN 33, creatinine elevated 1.93 from baseline 1.4, glucose 136, calcium 8.5.  CBC showed hemoglobin 11.5 down from recent baseline of 13-14.  Troponin normal.  Repeat pending.  Chest x-ray showed mild hazy opacity in left lower lobe representing atelectasis versus early pneumonia.  CT head ordered and is pending.  Patient received a liter IV fluids in the ED.  Review of Systems: As per HPI otherwise all other systems reviewed and are negative.  Past Medical History:  Diagnosis Date   Arthritis    hip.[email or web address]   Coronary artery disease    Dysrhythmia    History of kidney stones    Hypertension    Kidney stones    Meniere's disease     Past Surgical History:   Procedure Laterality Date   BACK SURGERY     L3-L4   CARDIAC CATHETERIZATION  2012   3 stents   CORONARY ANGIOPLASTY WITH STENT PLACEMENT  1996   3 stents   CYSTOSCOPY/URETEROSCOPY/HOLMIUM LASER/STENT PLACEMENT Right 09/13/2021   Procedure: RIGHT URETEROSCOPY/HOLMIUM LASER/STENT PLACEMENT;  Surgeon: Despina Arias, MD;  Location: [facility];  Service: Urology;  Laterality: Right;   INGUINAL HERNIA REPAIR Left    INGUINAL HERNIA REPAIR Right    LAMINECTOMY     L4-L5    Social History  reports that he quit smoking about 57 years ago. His smoking use included cigarettes. He started smoking about 71 years ago. He has a 28 pack-year smoking history. He has never used smokeless tobacco. He reports current alcohol use of about 14.0 standard drinks of alcohol per week. He reports that he does not use drugs.  No Known Allergies  Family History  Problem Relation Age of Onset   Other Mother 39       subdural hematoma- deceased   Stroke Father 60       deceased  Reviewed on admission  Prior to Admission medications   Medication Sig Start Date End Date Taking? Authorizing Provider  amLODipine (NORVASC) 5 MG tablet Take 1 tablet (5 mg total) by mouth daily. 03/12/23   Burchette, Elberta Fortis, MD  aspirin 325 MG tablet Take 325 mg by mouth daily.    [provider]  atorvastatin (LIPITOR) 40  MG tablet Take 1 tablet (40 mg total) by mouth daily. 03/12/23   Burchette, Elberta Fortis, MD  clopidogrel (PLAVIX) 75 MG tablet Take 1 tablet (75 mg total) by mouth daily. 03/12/23   Burchette, Elberta Fortis, MD  fexofenadine (ALLEGRA) 60 MG tablet Take 60 mg by mouth daily as needed for allergies or rhinitis.    [provider]  losartan-hydrochlorothiazide (HYZAAR) 100-12.5 MG tablet Take 1 tablet by mouth daily. 03/12/23   Burchette, Elberta Fortis, MD  metoprolol succinate (TOPROL-XL) 25 MG 24 hr tablet Take 1 tablet (25 mg total) by mouth daily. 03/12/23   Burchette, Elberta Fortis, MD  Naproxen Sodium 220 MG CAPS Take  220 mg by mouth daily as needed (Pain).    [provider]  nitroGLYCERIN (NITROSTAT) 0.4 MG SL tablet Place 0.4 mg under the tongue every 5 (five) minutes as needed for chest pain.    [provider]  pramipexole (MIRAPEX) 0.25 MG tablet TAKE 3 TO 4 TABLETS BY MOUTH EVERY NIGHT AT BEDTIME AS NEEDED 03/12/23   Burchette, Elberta Fortis, MD  tamsulosin (FLOMAX) 0.4 MG CAPS capsule Take 1 capsule (0.4 mg total) by mouth daily. 03/12/23   Kristian Covey, MD    Physical Exam: Vitals:   04/29/23 1235 04/29/23 1245 04/29/23 1300 04/29/23 1405  BP: (!) 77/55 103/63 135/68 123/80  Pulse: (!) 48 (!) 52 (!) 55 (!) 55  Resp: 16 19 20 17   Temp: 97.7 F (36.5 C)     SpO2: 100% 97% 100% 99%  Weight: 83.9 kg     Height: 5' 9.5" (1.765 m)       Physical Exam Constitutional:      General: He is not in acute distress.    Appearance: Normal appearance.  HENT:     Head: Normocephalic and atraumatic.     Mouth/Throat:     Mouth: Mucous membranes are moist.     Pharynx: Oropharynx is clear.  Eyes:     Extraocular Movements: Extraocular movements intact.     Pupils: Pupils are equal, round, and reactive to light.  Cardiovascular:     Rate and Rhythm: Regular rhythm. Bradycardia present.     Pulses: Normal pulses.     Heart sounds: Normal heart sounds.  Pulmonary:     Effort: Pulmonary effort is normal. No respiratory distress.     Breath sounds: Normal breath sounds.  Abdominal:     General: Bowel sounds are normal. There is no distension.     Palpations: Abdomen is soft.     Tenderness: There is no abdominal tenderness.  Musculoskeletal:        General: No swelling or deformity.     Right lower leg: Edema present.     Left lower leg: Edema present.  Skin:    General: Skin is warm and dry.  Neurological:     General: No focal deficit present.     Mental Status: Mental status is at baseline.    Labs on Admission: I have personally reviewed following labs and imaging  studies  CBC: Recent Labs  Lab 04/29/23 1318  WBC 6.1  HGB 11.5*  HCT 34.9*  MCV 94.8  PLT 188    Basic Metabolic Panel: Recent Labs  Lab 04/29/23 1318  NA 137  K 4.2  CL 106  CO2 20*  GLUCOSE 136*  BUN 33*  CREATININE 1.93*  CALCIUM 8.5*    GFR: Estimated Creatinine Clearance: 26.9 mL/min (A) (by C-G formula based on SCr of 1.93  mg/dL (H)).  Liver Function Tests: No results for input(s): "AST", "ALT", "ALKPHOS", "BILITOT", "PROT", "ALBUMIN" in the last 168 hours.  Urine analysis: No results found for: "COLORURINE", "APPEARANCEUR", "LABSPEC", "PHURINE", "GLUCOSEU", "HGBUR", "BILIRUBINUR", "KETONESUR", "PROTEINUR", "UROBILINOGEN", "NITRITE", "LEUKOCYTESUR"  Radiological Exams on Admission: DG Chest Portable 1 View  Result Date: 04/29/2023 CLINICAL DATA:  Chest pain and loss of consciousness. EXAM: PORTABLE CHEST 1 VIEW COMPARISON:  04/08/2010 FINDINGS: Heart size is normal. No pleural fluid or interstitial edema. Mild asymmetric hazy opacity identified over the left lower lung. The visualized osseous structures are unremarkable. IMPRESSION: Mild asymmetric hazy opacity over the left lower lung. This may represent atelectasis or developing pneumonia. Electronically Signed   By: Signa Kell M.D.   On: 04/29/2023 13:50    EKG: Independently reviewed.  Sinus bradycardia at 53 bpm.  First-degree AV block nonspecific T wave flattening.  Low voltage multiple leads.  Assessment/Plan Principal Problem:   Syncope and collapse Active Problems:   Hyperlipidemia   CORONARY ATHEROSCLEROSIS NATIVE CORONARY ARTERY   Hypertension, essential   Restless legs   BPH (benign prostatic hyperplasia)   CKD (chronic kidney disease) stage 3, GFR 30-59 ml/min (HCC)   Near syncope > Patient was at church and feeling somewhat weak and lightheaded after standing to sitting.  Family states he started to appear gray and then slumped over some but did not fully lose consciousness while sitting  in the pew. > Arrival to the ED blood pressure was initially in the high 70s systolic.  This rapidly improved with 500 cc bolus to 100s-130s systolic now. > Though there is no prodrome possibility of orthostasis given low blood pressure and AKI suspected to be secondary to dehydration as well.  - Monitor on telemetry overnight - Echocardiogram - Continue with IV fluids - Orthostatic vital signs - Trend renal function and electrolytes - Supportive care  AKI on CKD 3A > Creatinine elevated to 1.93 from baseline 1.4.  Blood pressure initially low as above but improved with IV fluids in the ED.  Currently vitals remained stable. - Monitor on telemetry above - Continue IV fluids overnight - Trend renal function and electrolytes  Hypertension - Holding amlodipine, losartan, hydrochlorothiazide, metoprolol in the setting of above  Hyperlipidemia - Continue home atorvastatin  CAD > Status post stenting > Reports due to bruising/bleeding currently taking Plavix 2 days on then 1 day off and during that day off from Plavix is taking aspirin.  Today he took aspirin, so the next 2 days he will be taking Plavix.  This is not necessarily has been prescribed. - Continue home Plavix, atorvastatin - Holding losartan, metoprolol as above  BPH - Continue tamsulosin  RLS - Continue home pramipexole  DVT prophylaxis: Lovenox Code Status:   Full Family Communication:  Updated at bedside Disposition Plan:   Patient is from:  Home  Anticipated DC to:  Home  Anticipated DC date:  1 to 2 days  Anticipated DC barriers: None  Consults called:  None Admission status:  Observation, telemetry  Severity of Illness: The appropriate patient status for this patient is OBSERVATION. Observation status is judged to be reasonable and necessary in order to provide the required intensity of service to ensure the patient's safety. The patient's presenting symptoms, physical exam findings, and initial radiographic  and laboratory data in the context of their medical condition is felt to place them at decreased risk for further clinical deterioration. Furthermore, it is anticipated that the patient will be medically stable for discharge  from the hospital within 2 midnights of admission.    Synetta Fail MD [facility]  How to contact the [facility] Attending or Consulting provider 7A - 7P or covering provider during after hours 7P -7A, for this patient?   Check the care team in [facility] and look for a) attending/consulting [facility] provider listed and b) the [facility] team listed Log into [email or web address] and use [place] universal password to access. If you do not have the password, please contact the hospital operator. Locate the [facility] provider you are looking for under [facility] and page to a number that you can be directly reached. If you still have difficulty reaching the provider, please page the [facility] (Director on Call) for the Hospitalists listed on amion for assistance.  04/29/2023, 3:01 PM

## 2023-04-29 NOTE — ED Provider Notes (Signed)
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  Family reports that he looked somewhat gray, was sitting down at the time, and then collapsed.  He endorsed some right sided arm numbness that lasted for about 10 minutes but has since resolved.  Previous history of 2 coronary stents, still taking his Plavix.  He denies any chest pain, feeling of heart racing.  He had had 2 donuts prior to church today.  He denies any weakness, numbness, tingling at this time.  Has had previous syncopal episodes in the past.  Per wife has never had chest pain with his CAD, just failed a stress test which showed greater than 90% blockage of an artery.   Loss of Consciousness      Home Medications Prior to Admission medications   Medication Sig Start Date End Date Taking? Authorizing Provider  amLODipine (NORVASC) 5 MG tablet Take 1 tablet (5 mg total) by mouth daily. 03/12/23   Burchette, Elberta Fortis, MD  aspirin 325 MG tablet Take 325 mg by mouth daily.    [provider]  atorvastatin (LIPITOR) 40 MG tablet Take 1 tablet (40 mg total) by mouth daily. 03/12/23   Burchette, Elberta Fortis, MD  clopidogrel (PLAVIX) 75 MG tablet Take 1 tablet (75 mg total) by mouth daily. 03/12/23   Burchette, Elberta Fortis, MD  fexofenadine (ALLEGRA) 60 MG tablet Take 60 mg by mouth daily as needed for allergies or rhinitis.    [provider]  losartan-hydrochlorothiazide (HYZAAR) 100-12.5 MG tablet Take 1 tablet by mouth daily. 03/12/23   Burchette, Elberta Fortis, MD  metoprolol succinate (TOPROL-XL) 25 MG 24 hr tablet Take 1 tablet (25 mg total) by mouth daily. 03/12/23   Burchette, Elberta Fortis, MD  Naproxen Sodium 220 MG  CAPS Take 220 mg by mouth daily as needed (Pain).    [provider]  nitroGLYCERIN (NITROSTAT) 0.4 MG SL tablet Place 0.4 mg under the tongue every 5 (five) minutes as needed for chest pain.    [provider]  pramipexole (MIRAPEX) 0.25 MG tablet TAKE 3 TO 4 TABLETS BY MOUTH EVERY NIGHT AT BEDTIME AS NEEDED 03/12/23   Burchette, Elberta Fortis, MD  tamsulosin (FLOMAX) 0.4 MG CAPS capsule Take 1 capsule (0.4 mg total) by mouth daily. 03/12/23   Burchette, Elberta Fortis, MD      Allergies    Patient has no known allergies.    Review of Systems   Review of Systems  Cardiovascular:  Positive for syncope.  All other systems reviewed and are negative.   Physical Exam Updated Vital Signs BP 123/80   Pulse (!) 55   Temp 97.7 F (36.5 C)   Resp 17   Ht 5' 9.5" (1.765 m)   Wt 83.9 kg   SpO2 99%   BMI 26.93 kg/m  Physical Exam Neurological:     Comments: Cranial nerves II through XII grossly intact.  Intact finger-nose, intact heel-to-shin.  Deferred Romberg, gait initially due to significant hypotension on arrival.  Alert and oriented x3.  Moves all 4 limbs spontaneously, normal coordination.  No pronator drift.  Intact strength 5 out of 5 bilateral upper and lower extremities.  ED Results / Procedures / Treatments   Labs (all labs ordered are listed, but only abnormal results are displayed) Labs Reviewed  BASIC METABOLIC PANEL - Abnormal; Notable for the following components:      Result Value   CO2 20 (*)    Glucose, Bld 136 (*)    BUN 33 (*)    Creatinine, Ser 1.93 (*)    Calcium 8.5 (*)    GFR, Estimated 33 (*)    All other components within normal limits  CBC - Abnormal; Notable for the following components:   RBC 3.68 (*)    Hemoglobin 11.5 (*)    HCT 34.9 (*)    All other components within normal limits  TROPONIN I (HIGH SENSITIVITY)  TROPONIN I (HIGH SENSITIVITY)    EKG None  Radiology DG Chest Portable 1 View  Result Date: 04/29/2023 CLINICAL  DATA:  Chest pain and loss of consciousness. EXAM: PORTABLE CHEST 1 VIEW COMPARISON:  04/08/2010 FINDINGS: Heart size is normal. No pleural fluid or interstitial edema. Mild asymmetric hazy opacity identified over the left lower lung. The visualized osseous structures are unremarkable. IMPRESSION: Mild asymmetric hazy opacity over the left lower lung. This may represent atelectasis or developing pneumonia. Electronically Signed   By: Signa Kell M.D.   On: 04/29/2023 13:50    Procedures Procedures    Medications Ordered in ED Medications  sodium chloride 0.9 % bolus 500 mL (has no administration in time range)  atorvastatin (LIPITOR) tablet 40 mg (has no administration in time range)  tamsulosin (FLOMAX) capsule 0.4 mg (has no administration in time range)  pramipexole (MIRAPEX) tablet 1 mg (has no administration in time range)  sodium chloride flush (NS) 0.9 % injection 3 mL (has no administration in time range)  enoxaparin (LOVENOX) injection 30 mg (has no administration in time range)  0.9 %  sodium chloride infusion (has no administration in time range)  acetaminophen (TYLENOL) tablet 650 mg (has no administration in time range)    Or  acetaminophen (TYLENOL) suppository 650 mg (has no administration in time range)  polyethylene glycol (MIRALAX / GLYCOLAX) packet 17 g (has no administration in time range)  sodium chloride 0.9 % bolus 500 mL (500 mLs Intravenous New Bag/Given 04/29/23 1315)    ED Course/ Medical Decision Making/ A&P                                 Medical Decision Making Amount and/or Complexity of Data Reviewed Labs: ordered. Radiology: ordered.  Risk Decision regarding hospitalization.   This patient is a 89 y.o. male who presents to the ED for concern of syncope and collapse, this involves an extensive number of treatment options, and is a complaint that carries with it a high risk of complications and morbidity. The emergent differential diagnosis prior to  evaluation includes, but is not limited to,  CVA, ACS, arrhythmia, vasovagal syncope, orthostatic hypotension, sepsis, hypoglycemia, electrolyte disturbance, respiratory failure, symptomatic anemia, dehydration, heat injury, polypharmacy, malignancy, anxiety/panic attack.  . This is not an exhaustive differential.   Past Medical History / Co-morbidities / Social History: CAD, previous stents, CKD, BPH, PVCs, hyperlipidemia Mnire's disease  Additional history: Chart reviewed. Pertinent results include: Reviewed lab work, imaging from patient's recent previous emergency department visits  Physical Exam: Physical exam performed. The pertinent findings include: On arrival to the emergency department patient is quite hypotensive, blood pressure 77/55, with somewhat bradycardic heart rate.  After being placed in the bed, administering fluid bolus he has been normotensive with no fever, bradycardia mildly improved to around mid 50s.  Normal rhythm.  No focal neurologic deficits on exam.  Lab Tests: I ordered, and personally interpreted labs.  The pertinent results include: CBC with mild anemia, hemoglobin 11.5.  BMP with creatinine of 1.9, BUN 33, suspicious for AKI, initial troponin 13 which is reassuring.   Imaging Studies: I ordered imaging studies including plain chest x-ray which shows no evidence of acute intrathoracic abnormality, CT head pending at this time, however overall very low clinical suspicion for stroke with no focal neurologic deficits, however given his transient right arm numbness I do think that CT head is reasonable. I independently visualized and interpreted imaging which is pending at this time. I agree with the radiologist interpretation.   Cardiac Monitoring:  The patient was maintained on a cardiac monitor.  My attending physician Dr. Andria Meuse viewed and interpreted the cardiac monitored which showed an underlying rhythm of: NSR, prolonged PR interval. I agree with this  interpretation.   Medications: I ordered medication including fluid bolus for hypotension, syncope.   Consultations Obtained: I requested consultation with the hospitalist, spoke with Dr. Cyndee Brightly,  and discussed lab and imaging findings as well as pertinent plan - they recommend: Admission for syncope, AKI, dehydration   Disposition: After consideration of the diagnostic results and the patients response to treatment, I feel that patient would benefit from admission at this time   I discussed this case with my attending physician Dr. Andria Meuse who cosigned this note including patient's presenting symptoms, physical exam, and planned diagnostics and interventions. Attending physician stated agreement with plan or made changes to plan which were implemented.    Final Clinical Impression(s) / ED Diagnoses Final diagnoses:  Syncope and collapse  AKI (acute kidney injury) [facility])    Rx / DC Orders ED Discharge Orders     None         Olene Floss, PA-C 04/29/23 1503    Anders Simmonds T, DO 05/01/23 [phone]

## 2023-04-29 NOTE — ED Notes (Signed)
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 Yes   Triage Complete: Triage complete  Chief Complaint Syncope and collapse [R55]  Triage Note Patient had near-syncope at church-thready pulse and appearing gray per family. Did not call EMS but arrived POV to ED. Pt A/O but appears very weak.    Allergies No Known Allergies  Level of Care/Admitting Diagnosis ED Disposition     ED Disposition  Admit   Condition  --   Comment  Hospital Area: [facility] [[id number]]  Level of Care: Telemetry Medical [104]  May place patient in observation at [facility] or [facility] if equivalent level of care is available:: No  Covid Evaluation: Asymptomatic - no recent exposure (last 10 days) testing not required  Diagnosis: Syncope and collapse [780.2.ICD-9-CM]  Admitting Physician: Synetta Fail [[id number]]  Attending Physician: Synetta Fail [[id number]]          B Medical/Surgery History Past Medical History:  Diagnosis Date   Arthritis    hip.knees   Coronary artery disease    Dysrhythmia    History of kidney stones    Hypertension    Kidney stones    Meniere's disease    Past Surgical History:  Procedure Laterality Date   BACK SURGERY     L3-L4   CARDIAC CATHETERIZATION  2012   3 stents   CORONARY ANGIOPLASTY WITH STENT PLACEMENT  1996   3 stents   CYSTOSCOPY/URETEROSCOPY/HOLMIUM LASER/STENT PLACEMENT Right 09/13/2021   Procedure: RIGHT URETEROSCOPY/HOLMIUM LASER/STENT PLACEMENT;  Surgeon: Despina Arias, MD;  Location: [facility];  Service: Urology;  Laterality: Right;   INGUINAL HERNIA REPAIR Left    INGUINAL HERNIA REPAIR Right    LAMINECTOMY     L4-L5     A IV Location/Drains/Wounds Patient  Lines/Drains/Airways Status     Active Line/Drains/Airways     Name Placement date Placement time Site Days   Peripheral IV 04/29/23 20 G Left Antecubital 04/29/23  1312  Antecubital  less than 1   Ureteral Drain/Stent Right ureter 6 Fr. 09/13/21  1449  Right ureter  593            Intake/Output Last 24 hours  Intake/Output Summary (Last 24 hours) at 04/29/2023 1517 Last data filed at 04/29/2023 1500 Gross per 24 hour  Intake 500 ml  Output --  Net 500 ml    Labs/Imaging Results for orders placed or performed during the hospital encounter of 04/29/23 (from the past 48 hour(s))  Basic metabolic panel     Status: Abnormal   Collection Time: 04/29/23  1:18 PM  Result Value Ref Range   Sodium 137 135 - 145 mmol/L   Potassium 4.2 3.5 - 5.1 mmol/L   Chloride 106 98 - 111 mmol/L   CO2 20 (L) 22 - 32 mmol/L   Glucose, Bld 136 (H) 70 - 99 mg/dL    Comment: Glucose reference range applies only to samples taken after fasting for at least 8 hours.   BUN 33 (H) 8 - 23 mg/dL   Creatinine, Ser 8.29 (H) 0.61 - 1.24 mg/dL   Calcium 8.5 (L) 8.9 - 10.3 mg/dL   GFR, Estimated 33 (L) >60 mL/min    Comment: (NOTE) Calculated using the CKD-EPI Creatinine Equation (  2021)    Anion gap 11 5 - 15    Comment: Performed at [facility], [address], [place], [place] [address]  CBC     Status: Abnormal   Collection Time: 04/29/23  1:18 PM  Result Value Ref Range   WBC 6.1 4.0 - 10.5 K/uL   RBC 3.68 (L) 4.22 - 5.81 MIL/uL   Hemoglobin 11.5 (L) 13.0 - 17.0 g/dL   HCT 19.1 (L) 47.8 - 29.5 %   MCV 94.8 80.0 - 100.0 fL   MCH 31.3 26.0 - 34.0 pg   MCHC 33.0 30.0 - 36.0 g/dL   RDW 62.1 30.8 - 65.7 %   Platelets 188 150 - 400 K/uL   nRBC 0.0 0.0 - 0.2 %    Comment: Performed at [facility], [address] [place], [place] [address]  Troponin I (High Sensitivity)     Status: None   Collection Time: 04/29/23  1:18 PM  Result Value Ref Range   Troponin I (High Sensitivity) 13 <18  ng/L    Comment: (NOTE) Elevated high sensitivity troponin I (hsTnI) values and significant  changes across serial measurements may suggest ACS but many other  chronic and acute conditions are known to elevate hsTnI results.  Refer to the "Links" section for chest pain algorithms and additional  guidance. Performed at [facility], [address] [place], [place] [address]    CT Head Wo Contrast  Result Date: 04/29/2023 CLINICAL DATA:  Provided history: Syncope/presyncope, cerebrovascular cause suspected. EXAM: CT HEAD WITHOUT CONTRAST TECHNIQUE: Contiguous axial images were obtained from the base of the skull through the vertex without intravenous contrast. RADIATION DOSE REDUCTION: This exam was performed according to the departmental dose-optimization program which includes automated exposure control, adjustment of the mA and/or kV according to patient size and/or use of iterative reconstruction technique. COMPARISON:  None. FINDINGS: Brain: Mild generalized cerebral atrophy. Extra-axial CSF density prominence overlying the left frontal and left parietal lobes (measuring up to 8 mm in thickness) consistent with a subdural hygroma or chronic subdural hematoma. A small subdural hygroma or chronic subdural hematoma is also questioned along the right frontal lobe (measuring up to 6 mm in thickness) (series 3, image 16). 2-3 mm rightward midline shift. There is no acute intracranial hemorrhage. No demarcated cortical infarct. No evidence of an intracranial mass. No midline shift. Vascular: No hyperdense vessel.  Atherosclerotic calcifications. Skull: No calvarial fracture or aggressive osseous lesion. Sinuses/Orbits: No mass or acute finding within the imaged orbits. Moderate mucosal thickening within the left maxillary sinus at the imaged levels. Mild mucosal thickening within right ethmoid air cells. Extensive opacification of left ethmoid air cells due to the presence of mucosal thickening and  likely fluid. Mucosal thickening within the inferior frontal sinuses (left greater than right). IMPRESSION: 1. No evidence of acute intracranial hemorrhage or an acute infarct. 2. Subdural hygroma or chronic subdural hematoma overlying the left frontal and left parietal lobes (measuring up to 8 mm in thickness). 3. Possible small subdural hygroma or chronic subdural hematoma along the right frontal lobe (measuring up to 6 mm in thickness). 4. 2-3 mm rightward midline shift. 5. Mild generalized cerebral atrophy. 6. Paranasal sinus disease at the imaged levels as described. Electronically Signed   By: Jackey Loge D.O.   On: 04/29/2023 15:14   DG Chest Portable 1 View  Result Date: 04/29/2023 CLINICAL DATA:  Chest pain and loss of consciousness. EXAM: PORTABLE CHEST 1 VIEW COMPARISON:  04/08/2010 FINDINGS: Heart size is normal. No pleural fluid or interstitial edema. Mild asymmetric hazy opacity identified over the left lower lung. The visualized osseous structures are unremarkable. IMPRESSION: Mild asymmetric hazy opacity over the left lower lung. This may represent atelectasis or developing pneumonia. Electronically Signed   By: Signa Kell M.D.   On: 04/29/2023 13:50    Pending Labs Unresulted Labs (From admission, onward)     Start     Ordered   05/06/23 0500  Creatinine, serum  (enoxaparin (LOVENOX)    CrCl < 30 ml/min)  Once,   R       Comments: while on enoxaparin therapy.    04/29/23 1501   04/30/23 0500  Comprehensive metabolic panel  Tomorrow morning,   R        04/29/23 1501   04/30/23 0500  CBC  Tomorrow morning,   R        04/29/23 1501            Vitals/Pain Today's Vitals   04/29/23 1235 04/29/23 1245 04/29/23 1300 04/29/23 1405  BP: (!) 77/55 103/63 135/68 123/80  Pulse: (!) 48 (!) 52 (!) 55 (!) 55  Resp: 16 19 20 17   Temp: 97.7 F (36.5 C)     SpO2: 100% 97% 100% 99%  Weight: 83.9 kg     Height: 5' 9.5" (1.765 m)     PainSc: 0-No pain       Isolation  Precautions No active isolations  Medications Medications  sodium chloride 0.9 % bolus 500 mL (has no administration in time range)  atorvastatin (LIPITOR) tablet 40 mg (has no administration in time range)  tamsulosin (FLOMAX) capsule 0.4 mg (has no administration in time range)  pramipexole (MIRAPEX) tablet 1 mg (has no administration in time range)  sodium chloride flush (NS) 0.9 % injection 3 mL (has no administration in time range)  enoxaparin (LOVENOX) injection 30 mg (has no administration in time range)  0.9 %  sodium chloride infusion (has no administration in time range)  acetaminophen (TYLENOL) tablet 650 mg (has no administration in time range)    Or  acetaminophen (TYLENOL) suppository 650 mg (has no administration in time range)  polyethylene glycol (MIRALAX / GLYCOLAX) packet 17 g (has no administration in time range)  sodium chloride 0.9 % bolus 500 mL (0 mLs Intravenous Stopped 04/29/23 1500)    Mobility walks     Focused Assessments Neuro Assessment Handoff:  Neuro Assessment: Within Defined Limits   R Recommendations: See Admitting Provider Note  Report given to:   Additional Notes:

## 2023-04-29 NOTE — Progress Notes (Signed)
Patient received on the floor at 1745 PM via bed. On RA. Alert and oriented. Will continue to monitor

## 2023-04-29 NOTE — ED Triage Notes (Signed)
Patient had near-syncope at [place] pulse and appearing gray per family. Did not call EMS but arrived POV to ED. Pt A/O but appears very weak.

## 2023-04-29 NOTE — ED Notes (Signed)
Patient placed onto cardiac monitor. Family is  at bedside at this time.

## 2023-04-30 ENCOUNTER — Observation Stay (HOSPITAL_COMMUNITY): Payer: Medicare HMO

## 2023-04-30 DIAGNOSIS — R55 Syncope and collapse: Secondary | ICD-10-CM | POA: Diagnosis not present

## 2023-04-30 LAB — COMPREHENSIVE METABOLIC PANEL
ALT: 14 U/L (ref 0–44)
AST: 15 U/L (ref 15–41)
Albumin: 3 g/dL — ABNORMAL LOW (ref 3.5–5.0)
Alkaline Phosphatase: 68 U/L (ref 38–126)
Anion gap: 7 (ref 5–15)
BUN: 23 mg/dL (ref 8–23)
CO2: 23 mmol/L (ref 22–32)
Calcium: 8.4 mg/dL — ABNORMAL LOW (ref 8.9–10.3)
Chloride: 110 mmol/L (ref 98–111)
Creatinine, Ser: 1.63 mg/dL — ABNORMAL HIGH (ref 0.61–1.24)
GFR, Estimated: 40 mL/min — ABNORMAL LOW (ref >60)
Glucose, Bld: 100 mg/dL — ABNORMAL HIGH (ref 70–99)
Potassium: 3.6 mmol/L (ref 3.5–5.1)
Sodium: 140 mmol/L (ref 135–145)
Total Bilirubin: 0.6 mg/dL (ref 0.3–1.2)
Total Protein: 5.6 g/dL — ABNORMAL LOW (ref 6.5–8.1)

## 2023-04-30 LAB — CBC
HCT: 34.7 % — ABNORMAL LOW (ref 39.0–52.0)
Hemoglobin: 11.4 g/dL — ABNORMAL LOW (ref 13.0–17.0)
MCH: 30.6 pg (ref 26.0–34.0)
MCHC: 32.9 g/dL (ref 30.0–36.0)
MCV: 93.3 fL (ref 80.0–100.0)
Platelets: 190 10*3/uL (ref 150–400)
RBC: 3.72 MIL/uL — ABNORMAL LOW (ref 4.22–5.81)
RDW: 12.8 % (ref 11.5–15.5)
WBC: 5.2 10*3/uL (ref 4.0–10.5)
nRBC: 0 % (ref 0.0–0.2)

## 2023-04-30 LAB — GLUCOSE, CAPILLARY: Glucose-Capillary: 88 mg/dL (ref 70–99)

## 2023-04-30 MED ORDER — SODIUM CHLORIDE 0.9 % IV SOLN: INTRAVENOUS | Status: DC

## 2023-04-30 NOTE — Care Management Obs Status (Signed)
MEDICARE OBSERVATION STATUS NOTIFICATION   Patient Details  Name: Ronald Lardizabal, MD MRN: [id number] Date of Birth: Sep 30, 1931   Medicare Observation Status Notification Given:  Yes    Ronny Bacon, RN 04/30/2023, 9:54 AM
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
## 2023-04-30 NOTE — Discharge Instructions (Signed)
May resume your home blood pressure medicines.  Make sure to monitor your blood pressure at least 1-2 times daily and keep log.  If blood pressures become low, consider discontinuation of amlodipine versus reducing dose by 1/2 to 2.5 mg p.o. daily.  Ensure follow-up with your primary care doctor in 1-2 weeks.  FURTHER DISCHARGE INSTRUCTIONS:   Get Medicines reviewed and adjusted: Please take all your medications with you for your next visit with your Primary MD   Laboratory/radiological data: Please request your Primary MD to go over all hospital tests and procedure/radiological results at the follow up, please ask your Primary MD to get all Hospital records sent to his/her office.   In some cases, they will be blood work, cultures and biopsy results pending at the time of your discharge. Please request that your primary care M.D. goes through all the records of your hospital data and follows up on these results.   Also Note the following: If you experience worsening of your admission symptoms, develop shortness of breath, life threatening emergency, suicidal or homicidal thoughts you must seek medical attention immediately by calling 911 or calling your MD immediately  if symptoms less severe.   You must read complete instructions/literature along with all the possible adverse reactions/side effects for all the Medicines you take and that have been prescribed to you. Take any new Medicines after you have completely understood and accpet all the possible adverse reactions/side effects.    Do not drive when taking Pain medications or sleeping medications (Benzodaizepines)   Do not take more than prescribed Pain, Sleep and Anxiety Medications. It is not advisable to combine anxiety,sleep and pain medications without talking with your primary care practitioner   Special Instructions: If you have smoked or chewed Tobacco  in the last 2 yrs please stop smoking, stop any regular Alcohol  and or any  Recreational drug use.   Wear Seat belts while driving.   Please note: You were cared for by a hospitalist during your hospital stay. Once you are discharged, your primary care physician will handle any further medical issues. Please note that NO REFILLS for any discharge medications will be authorized once you are discharged, as it is imperative that you return to your primary care physician (or establish a relationship with a primary care physician if you do not have one) for your post hospital discharge needs so that they can reassess your need for medications and monitor your lab values.

## 2023-04-30 NOTE — Progress Notes (Signed)
PT Cancellation Note  Patient Details Name: Ronald Calzadilla, MD MRN: [id number] DOB: 08/01/1931   Cancelled Treatment:    Reason Eval/Treat Not Completed: PT screened, no needs identified, will sign off. Pt reports no needs with D/C order written and kindly declined need for evaluation.   Enedina Finner Chicquita Mendel 04/30/2023, 10:12 AM Merryl Hacker, PT Acute Rehabilitation Services Office: [phone]

## 2023-05-07 ENCOUNTER — Ambulatory Visit (INDEPENDENT_AMBULATORY_CARE_PROVIDER_SITE_OTHER): Payer: Medicare HMO | Admitting: Family Medicine

## 2023-05-07 ENCOUNTER — Encounter: Payer: Self-pay | Admitting: Family Medicine

## 2023-05-07 VITALS — BP 130/80 | HR 61 | Temp 97.6°F | Ht 69.5 in | Wt 188.2 lb

## 2023-05-07 DIAGNOSIS — I10 Essential (primary) hypertension: Secondary | ICD-10-CM

## 2023-05-07 DIAGNOSIS — N1831 Chronic kidney disease, stage 3a: Secondary | ICD-10-CM

## 2023-05-07 DIAGNOSIS — R55 Syncope and collapse: Secondary | ICD-10-CM | POA: Diagnosis not present

## 2023-05-07 NOTE — Patient Instructions (Addendum)
Monitor blood pressure and be in touch if any further episodes of dizziness.   If so, we can back down dose of Amlodipine.   Stay well hydrated.

## 2023-05-07 NOTE — Progress Notes (Signed)
Established Patient Office Visit  Subjective   Patient ID: Ronald Raring, MD, male    DOB: [birth year]  Age: 89 y.o. MRN: [id number]  Chief Complaint  Patient presents with   Medical Management of Chronic Issues    Follow up on BP med.    Hospitalization Follow-up    For dizzy. Pt denied feeling dizzy today.     HPI   Ronald Case is seen for hospital follow-up.  Hospital notes reviewed.  He was admitted 29 September and discharged next day.  He had near syncopal episode.  We had recently added amlodipine to his regimen for hypertension and blood pressures have generally been well-controlled.  Not consistently complained of any dizziness symptoms.  Morning of admission he was at church and underwent water submersion baptism.  About 30 minutes later after he was seen by down family noted he looked somewhat gray and then he collapsed in the pew but did not have a full syncopal episode.  No chest pain.  He states has had vasovagal episodes really since medical school intermittently over the years.  Peers he was somewhat dehydrated with creatinine 1.93 with baseline around 1.4.  He had eaten that morning.  Denied any fever, chest pain, nausea, vomiting, or diarrhea.  Blood pressure in ER 77/55.  Heart rate 48.  Electrolytes stable.  Troponin normal.  Chest x-ray showed probably some atelectasis.  CT head no acute hemorrhage.  Was hydrated with IV fluids and creatinine did come back down near baseline.  No significant arrhythmias on monitoring.  EKG no acute findings.    Recent trip to [place].  No pleuritic pain.  No dyspnea.  Some mild bilateral ankle edema which is unchanged.  Past Medical History:  Diagnosis Date   Arthritis    hip.knees   Coronary artery disease    Dysrhythmia    History of kidney stones    Hypertension    Kidney stones    Meniere's disease    Past Surgical History:  Procedure Laterality Date   BACK SURGERY     L3-L4   CARDIAC CATHETERIZATION  2012   3 stents    CORONARY ANGIOPLASTY WITH STENT PLACEMENT  1996   3 stents   CYSTOSCOPY/URETEROSCOPY/HOLMIUM LASER/STENT PLACEMENT Right 09/13/2021   Procedure: RIGHT URETEROSCOPY/HOLMIUM LASER/STENT PLACEMENT;  Surgeon: Despina Arias, MD;  Location: [facility];  Service: Urology;  Laterality: Right;   INGUINAL HERNIA REPAIR Left    INGUINAL HERNIA REPAIR Right    LAMINECTOMY     L4-L5    reports that he quit smoking about 57 years ago. His smoking use included cigarettes. He started smoking about 71 years ago. He has a 28 pack-year smoking history. He has never used smokeless tobacco. He reports current alcohol use of about 14.0 standard drinks of alcohol per week. He reports that he does not use drugs. family history includes Other (age of onset: 73) in his mother; Stroke (age of onset: 34) in his father. No Known Allergies  Review of Systems  Constitutional:  Negative for chills, fever and malaise/fatigue.  Eyes:  Negative for blurred vision.  Respiratory:  Negative for cough and shortness of breath.   Cardiovascular:  Negative for chest pain.  Gastrointestinal:  Negative for abdominal pain.  Genitourinary:  Negative for dysuria.  Neurological:  Negative for dizziness, weakness and headaches.      Objective:     BP 130/80 (BP Location: Left Arm, Patient Position: Sitting, Cuff Size: Normal)   Pulse 61  Temp 97.6 F (36.4 C) (Oral)   Ht 5' 9.5" (1.765 m)   Wt 188 lb 3.2 oz (85.4 kg)   SpO2 97%   BMI 27.39 kg/m  BP Readings from Last 3 Encounters:  05/07/23 130/80  04/30/23 (!) 149/75  03/26/23 132/70   Wt Readings from Last 3 Encounters:  05/07/23 188 lb 3.2 oz (85.4 kg)  04/29/23 184 lb 8.4 oz (83.7 kg)  03/26/23 195 lb 8 oz (88.7 kg)      Physical Exam Vitals reviewed.  Constitutional:      Appearance: He is well-developed.  HENT:     Right Ear: External ear normal.     Left Ear: External ear normal.  Eyes:     Pupils: Pupils are equal, round, and reactive to light.  Neck:      Thyroid: No thyromegaly.  Cardiovascular:     Rate and Rhythm: Normal rate and regular rhythm.  Pulmonary:     Effort: Pulmonary effort is normal. No respiratory distress.     Breath sounds: Normal breath sounds. No wheezing or rales.  Musculoskeletal:     Cervical back: Neck supple.     Comments: Trace edema lower legs bilaterally  Neurological:     Mental Status: He is alert and oriented to person, place, and time.      No results found for any visits on 05/07/23.  Last CBC Lab Results  Component Value Date   WBC 5.2 04/30/2023   HGB 11.4 (L) 04/30/2023   HCT 34.7 (L) 04/30/2023   MCV 93.3 04/30/2023   MCH 30.6 04/30/2023   RDW 12.8 04/30/2023   PLT 190 04/30/2023   Last metabolic panel Lab Results  Component Value Date   GLUCOSE 100 (H) 04/30/2023   NA 140 04/30/2023   K 3.6 04/30/2023   CL 110 04/30/2023   CO2 23 04/30/2023   BUN 23 04/30/2023   CREATININE 1.63 (H) 04/30/2023   GFRNONAA 40 (L) 04/30/2023   CALCIUM 8.4 (L) 04/30/2023   PROT 5.6 (L) 04/30/2023   ALBUMIN 3.0 (L) 04/30/2023   BILITOT 0.6 04/30/2023   ALKPHOS 68 04/30/2023   AST 15 04/30/2023   ALT 14 04/30/2023   ANIONGAP 7 04/30/2023   Last lipids Lab Results  Component Value Date   CHOL 254 (H) 03/12/2023   HDL 38.30 (L) 03/12/2023   LDLCALC 99 03/10/2022   LDLDIRECT 56.0 03/12/2023   TRIG 324.0 (H) 03/12/2023   CHOLHDL 7 03/12/2023   Last hemoglobin A1c No results found for: "HGBA1C"    The ASCVD Risk score (Arnett DK, et al., 2019) failed to calculate for the following reasons:   The 2019 ASCVD risk score is only valid for ages 56 to 6    Assessment & Plan:   #1 recent episodes of near syncope.  Vasovagal versus hypotensive episode related to his blood pressure medications.  Blood pressure today seated 130/64 and standing was 130/68.  -We discussed possibly reducing his amlodipine 2.5 mg daily but blood pressure since discharge been very stable with current regimen.   Continue to monitor.  Follow-up promptly for any recurrent episodes -Also suggested increasing hydration with water early in the morning  #2 hypertension well-controlled on current regimen amlodipine, losartan/HCTZ, and metoprolol.  Continue to monitor as above  #3 chronic kidney disease.  Recent acute worsening probably in the setting of dehydration.  Improved with IV hydration.  Recheck basic metabolic panel at follow-up.  Discussed flu vaccination.  He has mild cold-like symptoms following  his recent trip and would like to wait a couple weeks before getting his flu vaccine Evelena Peat, MD

## 2023-05-08 DIAGNOSIS — H25812 Combined forms of age-related cataract, left eye: Secondary | ICD-10-CM | POA: Diagnosis not present

## 2023-05-22 DIAGNOSIS — H268 Other specified cataract: Secondary | ICD-10-CM | POA: Diagnosis not present

## 2023-05-22 DIAGNOSIS — H2181 Floppy iris syndrome: Secondary | ICD-10-CM | POA: Diagnosis not present

## 2023-05-22 DIAGNOSIS — H25812 Combined forms of age-related cataract, left eye: Secondary | ICD-10-CM | POA: Diagnosis not present

## 2023-05-31 DIAGNOSIS — H25811 Combined forms of age-related cataract, right eye: Secondary | ICD-10-CM | POA: Diagnosis not present

## 2023-06-09 ENCOUNTER — Other Ambulatory Visit: Payer: Self-pay | Admitting: Family Medicine

## 2023-06-12 DIAGNOSIS — H2181 Floppy iris syndrome: Secondary | ICD-10-CM | POA: Diagnosis not present

## 2023-06-12 DIAGNOSIS — H268 Other specified cataract: Secondary | ICD-10-CM | POA: Diagnosis not present

## 2023-06-12 DIAGNOSIS — H25811 Combined forms of age-related cataract, right eye: Secondary | ICD-10-CM | POA: Diagnosis not present

## 2023-07-16 DIAGNOSIS — D0359 Melanoma in situ of other part of trunk: Secondary | ICD-10-CM | POA: Diagnosis not present

## 2023-07-16 DIAGNOSIS — Z85828 Personal history of other malignant neoplasm of skin: Secondary | ICD-10-CM | POA: Diagnosis not present

## 2023-07-16 DIAGNOSIS — L821 Other seborrheic keratosis: Secondary | ICD-10-CM | POA: Diagnosis not present

## 2023-07-16 DIAGNOSIS — D0362 Melanoma in situ of left upper limb, including shoulder: Secondary | ICD-10-CM | POA: Diagnosis not present

## 2023-07-16 DIAGNOSIS — L570 Actinic keratosis: Secondary | ICD-10-CM | POA: Diagnosis not present

## 2023-07-16 DIAGNOSIS — B0089 Other herpesviral infection: Secondary | ICD-10-CM | POA: Diagnosis not present

## 2023-07-16 DIAGNOSIS — L820 Inflamed seborrheic keratosis: Secondary | ICD-10-CM | POA: Diagnosis not present

## 2023-07-16 DIAGNOSIS — L2089 Other atopic dermatitis: Secondary | ICD-10-CM | POA: Diagnosis not present

## 2023-07-16 DIAGNOSIS — D485 Neoplasm of uncertain behavior of skin: Secondary | ICD-10-CM | POA: Diagnosis not present

## 2023-07-20 ENCOUNTER — Ambulatory Visit (INDEPENDENT_AMBULATORY_CARE_PROVIDER_SITE_OTHER): Payer: Medicare HMO

## 2023-07-20 VITALS — Ht 69.5 in | Wt 188.0 lb

## 2023-07-20 DIAGNOSIS — Z0000 Encounter for general adult medical examination without abnormal findings: Secondary | ICD-10-CM | POA: Diagnosis not present

## 2023-07-20 NOTE — Progress Notes (Signed)
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  Visit Complete: Virtual I connected with  Susa Raring, MD on 07/20/23 by a audio enabled telemedicine application and verified that I am speaking with the correct person using two identifiers.  Patient Location: Home  Provider Location: Home Office  I discussed the limitations of evaluation and management by telemedicine. The patient expressed understanding and agreed to proceed.  Vital Signs: Because this visit was a virtual/telehealth visit, some criteria may be missing or patient reported. Any vitals not documented were not able to be obtained and vitals that have been documented are patient reported.    Cardiac Risk Factors include: advanced age (>75men, >32 women);male gender;hypertension     Objective:    Today's Vitals   07/20/23 1533  Weight: 188 lb (85.3 kg)  Height: 5' 9.5" (1.765 m)   Body mass index is 27.36 kg/m.     07/20/2023    3:39 PM 03/16/2023   10:41 AM 07/17/2022    1:42 PM 09/12/2021    8:16 AM 07/11/2021    1:17 PM 07/13/2020    3:42 PM  Advanced Directives  Does Patient Have a Medical Advance Directive? Yes No Yes Yes Yes Yes  Type of Estate agent of [place];Living will  Healthcare Power of [place];Living will Healthcare Power of [place];Living will Healthcare Power of [place];Living will Healthcare Power of [place];Living will  Does patient want to make changes to medical advance directive?      No - Patient declined  Copy of Healthcare Power of Attorney in Chart? No - copy requested  No - copy requested  No - copy requested No - copy requested  Would patient like information on creating a medical advance directive?  No - Patient declined        Current Medications (verified) Outpatient Encounter Medications as of 07/20/2023  Medication Sig   amLODipine (NORVASC) 5 MG tablet Take 1 tablet  (5 mg total) by mouth daily.   aspirin 325 MG tablet Take 325 mg by mouth daily.   atorvastatin (LIPITOR) 40 MG tablet Take 1 tablet (40 mg total) by mouth daily. (Patient taking differently: Take 40 mg by mouth daily. Taking 1/2 tab.)   clopidogrel (PLAVIX) 75 MG tablet Take 1 tablet (75 mg total) by mouth daily.   fexofenadine (ALLEGRA) 60 MG tablet Take 60 mg by mouth daily as needed for allergies or rhinitis.   losartan-hydrochlorothiazide (HYZAAR) 100-12.5 MG tablet Take 1 tablet by mouth daily.   metoprolol succinate (TOPROL-XL) 25 MG 24 hr tablet Take 1 tablet (25 mg total) by mouth daily.   Naproxen Sodium 220 MG CAPS Take 220 mg by mouth daily as needed (Pain). (Patient not taking: Reported on 05/07/2023)   nitroGLYCERIN (NITROSTAT) 0.4 MG SL tablet Place 0.4 mg under the tongue every 5 (five) minutes as needed for chest pain. (Patient not taking: Reported on 05/07/2023)   pramipexole (MIRAPEX) 0.25 MG tablet TAKE 3 TO 4 TABLETS BY MOUTH EVERY NIGHT AT BEDTIME AS NEEDED   tamsulosin (FLOMAX) 0.4 MG CAPS capsule Take 1 capsule (0.4 mg total) by mouth daily.   No facility-administered encounter medications on file as of 07/20/2023.    Allergies (verified) Patient has no known allergies.   History: Past Medical History:  Diagnosis Date   Arthritis    hip.knees   Coronary artery disease    Dysrhythmia    History of kidney stones  Hypertension    Kidney stones    Meniere's disease    Past Surgical History:  Procedure Laterality Date   BACK SURGERY     L3-L4   CARDIAC CATHETERIZATION  2012   3 stents   CORONARY ANGIOPLASTY WITH STENT PLACEMENT  1996   3 stents   CYSTOSCOPY/URETEROSCOPY/HOLMIUM LASER/STENT PLACEMENT Right 09/13/2021   Procedure: RIGHT URETEROSCOPY/HOLMIUM LASER/STENT PLACEMENT;  Surgeon: Despina Arias, MD;  Location: [facility];  Service: Urology;  Laterality: Right;   INGUINAL HERNIA REPAIR Left    INGUINAL HERNIA REPAIR Right    LAMINECTOMY     L4-L5    Family History  Problem Relation Age of Onset   Other Mother 61       subdural hematoma- deceased   Stroke Father 79       deceased   Social History   Socioeconomic History   Marital status: Married    Spouse name: Not on file   Number of children: Not on file   Years of education: Not on file   Highest education level: Not on file  Occupational History   Not on file  Tobacco Use   Smoking status: Former    Current packs/day: 0.00    Average packs/day: 2.0 packs/day for 14.0 years (28.0 ttl pk-yrs)    Types: Cigarettes    Start date: 08/01/1951    Quit date: 07/31/1965    Years since quitting: 58.0   Smokeless tobacco: Never  Vaping Use   Vaping status: Never Used  Substance and Sexual Activity   Alcohol use: Yes    Alcohol/week: 14.0 standard drinks of alcohol    Types: 14 Standard drinks or equivalent per week    Comment: 2/day   Drug use: No   Sexual activity: Not on file  Other Topics Concern   Not on file  Social History Narrative   Not on file   Social Drivers of Health   Financial Resource Strain: Low Risk  (07/20/2023)   Overall Financial Resource Strain (CARDIA)    Difficulty of Paying Living Expenses: Not hard at all  Food Insecurity: No Food Insecurity (07/20/2023)   Hunger Vital Sign    Worried About Running Out of Food in the Last Year: Never true    Ran Out of Food in the Last Year: Never true  Transportation Needs: No Transportation Needs (07/20/2023)   PRAPARE - Administrator, Civil Service (Medical): No    Lack of Transportation (Non-Medical): No  Physical Activity: Inactive (07/20/2023)   Exercise Vital Sign    Days of Exercise per Week: 0 days    Minutes of Exercise per Session: 0 min  Stress: No Stress Concern Present (07/20/2023)   [employer] of Occupational Health - Occupational Stress Questionnaire    Feeling of Stress : Not at all  Social Connections: Socially Integrated (07/20/2023)   Social Connection and  Isolation Panel [NHANES]    Frequency of Communication with Friends and Family: More than three times a week    Frequency of Social Gatherings with Friends and Family: More than three times a week    Attends Religious Services: More than 4 times per year    Active Member of [employer] or Organizations: Yes    Attends Engineer, structural: More than 4 times per year    Marital Status: Married    Tobacco Counseling Counseling given: Not Answered   Clinical Intake:  Pre-visit preparation completed: Yes  Pain : No/denies pain  BMI - recorded: 27.36 Nutritional Status: BMI 25 -29 Overweight Nutritional Risks: None Diabetes: No  How often do you need to have someone help you when you read instructions, pamphlets, or other written materials from your doctor or pharmacy?: 1 - Never  Interpreter Needed?: No  Information entered by :: Theresa Mulligan LPN   Activities of Daily Living    07/20/2023    3:35 PM  In your present state of health, do you have any difficulty performing the following activities:  Hearing? 0  Vision? 0  Difficulty concentrating or making decisions? 0  Walking or climbing stairs? 0  Dressing or bathing? 0  Doing errands, shopping? 0  Preparing Food and eating ? N  Using the Toilet? N  In the past six months, have you accidently leaked urine? N  Do you have problems with loss of bowel control? N  Managing your Medications? N  Managing your Finances? N  Housekeeping or managing your Housekeeping? N    Patient Care Team: Kristian Covey, MD as PCP - General (Family Medicine) Verner Chol, [facility] (Inactive) (Pharmacist) Verner Chol, [facility] (Inactive) as Pharmacist (Pharmacist)  Indicate any recent Medical Services you may have received from other than [facility] providers in the past year (date may be approximate).     Assessment:   This is a routine wellness examination for Harshiv.  Hearing/Vision screen Hearing Screening - Comments::  Denies hearing difficulties   Vision Screening - Comments:: Wears rx glasses - up to date with routine eye exams with  Dr Sherrine Maples   Goals Addressed               This Visit's Progress     Increase physical activity (pt-stated)         Depression Screen    07/20/2023    3:37 PM 05/07/2023    9:40 AM 03/12/2023    8:03 AM 09/21/2022   12:49 PM 07/17/2022    1:40 PM 03/10/2022    8:07 AM 07/11/2021    1:18 PM  PHQ 2/9 Scores  PHQ - 2 Score 0 0 0 0 0 0 0  PHQ- 9 Score  0 1   0     Fall Risk    07/20/2023    3:38 PM 05/07/2023    9:39 AM 03/12/2023    8:02 AM 07/17/2022    1:41 PM 03/10/2022    8:08 AM  Fall Risk   Falls in the past year? 1 0 1 0 1  Number falls in past yr: 0 0 0 0 0  Injury with Fall? 0 0 0 0 0  Risk for fall due to : No Fall Risks No Fall Risks;Other (Comment) Other (Comment) No Fall Risks No Fall Risks  Follow up Falls prevention discussed Falls evaluation completed Falls evaluation completed Falls prevention discussed Falls evaluation completed    MEDICARE RISK AT HOME: Medicare Risk at Home Any stairs in or around the home?: Yes If so, are there any without handrails?: No Home free of loose throw rugs in walkways, pet beds, electrical cords, etc?: Yes Adequate lighting in your home to reduce risk of falls?: Yes Life alert?: No Use of a cane, walker or w/c?: No Grab bars in the bathroom?: Yes Shower chair or bench in shower?: Yes Elevated toilet seat or a handicapped toilet?: No  TIMED UP AND GO:  Was the test performed?  No    Cognitive Function:        07/20/2023  3:39 PM 07/17/2022    1:42 PM  6CIT Screen  What Year? 0 points 0 points  What month? 0 points 0 points  What time? 0 points 0 points  Count back from 20 0 points 0 points  Months in reverse 0 points 0 points  Repeat phrase 0 points 0 points  Total Score 0 points 0 points    Immunizations Immunization History  Administered Date(s) Administered   Fluad Quad(high Dose  65+) 05/12/2021, 04/11/2022   Influenza-Unspecified 07/31/2016, 07/09/2023   PFIZER(Purple Top)SARS-COV-2 Vaccination 08/08/2019, 09/02/2019, 07/30/2020, 12/09/2020   Pfizer Covid-19 Vaccine Bivalent Booster 51yrs & up 05/12/2021   Pneumococcal Conjugate-13 07/31/2016   Pneumococcal Polysaccharide-23 03/15/2010   Tdap 03/08/2020    TDAP status: Up to date  Flu Vaccine status: Up to date  Pneumococcal vaccine status: Up to date  Covid-19 vaccine status: Declined, Education has been provided regarding the importance of this vaccine but patient still declined. Advised may receive this vaccine at local pharmacy or Health Dept.or vaccine clinic. Aware to provide a copy of the vaccination record if obtained from local pharmacy or Health Dept. Verbalized acceptance and understanding.  Qualifies for Shingles Vaccine? Yes   Zostavax completed No   Shingrix Completed?: No.    Education has been provided regarding the importance of this vaccine. Patient has been advised to call insurance company to determine out of pocket expense if they have not yet received this vaccine. Advised may also receive vaccine at local pharmacy or Health Dept. Verbalized acceptance and understanding.  Screening Tests Health Maintenance  Topic Date Due   Zoster Vaccines- Shingrix (1 of 2) Never done   COVID-19 Vaccine (6 - 2024-25 season) 04/01/2023   Medicare Annual Wellness (AWV)  07/19/2024   DTaP/Tdap/Td (2 - Td or Tdap) 03/08/2030   Pneumonia Vaccine 22+ Years old  Completed   INFLUENZA VACCINE  Completed   HPV VACCINES  Aged Out    Health Maintenance  Health Maintenance Due  Topic Date Due   Zoster Vaccines- Shingrix (1 of 2) Never done   COVID-19 Vaccine (6 - 2024-25 season) 04/01/2023         Additional Screening:    Vision Screening: Recommended annual ophthalmology exams for early detection of glaucoma and other disorders of the eye. Is the patient up to date with their annual eye exam?   Yes  Who is the provider or what is the name of the office in which the patient attends annual eye exams? Dr Sherrine Maples If pt is not established with a provider, would they like to be referred to a provider to establish care? No .   Dental Screening: Recommended annual dental exams for proper oral hygiene    Community Resource Referral / Chronic Care Management:  CRR required this visit?  No   CCM required this visit?  No     Plan:     I have personally reviewed and noted the following in the patient's chart:   Medical and social history Use of alcohol, tobacco or illicit drugs  Current medications and supplements including opioid prescriptions. Patient is not currently taking opioid prescriptions. Functional ability and status Nutritional status Physical activity Advanced directives List of other physicians Hospitalizations, surgeries, and ER visits in previous 12 months Vitals Screenings to include cognitive, depression, and falls Referrals and appointments  In addition, I have reviewed and discussed with patient certain preventive protocols, quality metrics, and best practice recommendations. A written personalized care plan for preventive services as well as  general preventive health recommendations were provided to patient.     Tillie Rung, LPN   [id number]   After Visit Summary: (MyChart) Due to this being a telephonic visit, the after visit summary with patients personalized plan was offered to patient via MyChart  None Nurse Notes: None

## 2023-07-20 NOTE — Patient Instructions (Addendum)
Ronald Case , Thank you for taking time to come for your Medicare Wellness Visit. I appreciate your ongoing commitment to your health goals. Please review the following plan we discussed and let me know if I can assist you in the future.   Referrals/Orders/Follow-Ups/Clinician Recommendations:   This is a list of the screening recommended for you and due dates:  Health Maintenance  Topic Date Due   Zoster (Shingles) Vaccine (1 of 2) Never done   COVID-19 Vaccine (6 - 2024-25 season) 04/01/2023   Medicare Annual Wellness Visit  07/19/2024   DTaP/Tdap/Td vaccine (2 - Td or Tdap) 03/08/2030   Pneumonia Vaccine  Completed   Flu Shot  Completed   HPV Vaccine  Aged Out    Advanced directives: (Copy Requested) Please bring a copy of your health care power of attorney and living will to the office to be added to your chart at your convenience.  Next Medicare Annual Wellness Visit scheduled for next year: Yes

## 2023-08-06 ENCOUNTER — Ambulatory Visit: Payer: Self-pay | Admitting: Family Medicine

## 2023-08-06 NOTE — Telephone Encounter (Signed)
**Note De-identified  [place] Obfuscation** Please advise 

## 2023-08-06 NOTE — Telephone Encounter (Signed)
 Chief Complaint: medication reaction Symptoms: lower leg swelling Frequency: worsening since October Pertinent Negatives: Patient denies facial swelling, hand swelling, chest pain, SOB, redness, signs of infection, unilateral swelling Disposition: [] ED /[] Urgent Care (no appt availability in office) / [x] Appointment(In office/virtual)/ []  [facility]/ [] Home Care/ [x] Refused Recommended Disposition /[] [facility]/ []  Follow-up with PCP Additional Notes: Patient asking if he needs to be seen for symptoms or if his PCP will just advise him to reduce or stop amlodipine  due to side effects. Advised patient to make appointment, patient prefers to be seen by his PCP. Appointment not available within 24 hours.  Copied from CRM [id number]. Topic: Clinical - Pink Word Triage >> Aug 06, 2023  9:18 AM [name] wrote: Reason for Triage: PATIENT CALLED STATING HE THINKS HE IS HAVING AN ALLERGIC REACTION TO A MEDICATION THAT HE IS CURRENTLY TAKING. THE MEDICATION IS AMLODIPINE  2 1/2 MG. SWELLING IN FEET AND LEGS Reason for Disposition  [1] MODERATE leg swelling (e.g., swelling extends up to knees) AND [2] new-onset or worsening  Answer Assessment - Initial Assessment Questions 1. ONSET: When did the swelling start? (e.g., minutes, hours, days)     Patient states he had a little bit of swelling prior to October 7th. Patient states he started amlodipine  in October and swelling has worsened since then.  2. LOCATION: What part of the leg is swollen?  Are both legs swollen or just one leg?     Both legs, from knee down to feet.  3. SEVERITY: How bad is the swelling? (e.g., localized; mild, moderate, severe)   - Localized: Small area of swelling localized to one leg.   - MILD pedal edema: Swelling limited to foot and ankle, pitting edema < 1/4 inch (6 mm) deep, rest and elevation eliminate most or all swelling.   - MODERATE edema: Swelling of lower leg to knee, pitting edema > 1/4  inch (6 mm) deep, rest and elevation only partially reduce swelling.   - SEVERE edema: Swelling extends above knee, facial or hand swelling present.      Patient states swelling is from below knees to feet and involves 4+ pitting edema.  4. REDNESS: Does the swelling look red or infected?     Denies redness or infection.  5. PAIN: Is the swelling painful to touch? If Yes, ask: How painful is it?   (Scale 1-10; mild, moderate or severe)     Patient states at baseline he has arthritis and due to old age he has daily pain, no worsening of his pain.  6. FEVER: Do you have a fever? If Yes, ask: What is it, how was it measured, and when did it start?      Denies.  7. CAUSE: What do you think is causing the leg swelling?     Patient states he thinks this is related to being put on amlodipine  (calcium  channel blocker) causing the swelling in his legs.  8. MEDICAL HISTORY: Do you have a history of blood clots (e.g., DVT), cancer, heart failure, kidney disease, or liver failure?     Denies.  9. RECURRENT SYMPTOM: Have you had leg swelling before? If Yes, ask: When was the last time? What happened that time?     Patient states he has had slight swelling in the past but not this severe.  10. OTHER SYMPTOMS: Do you have any other symptoms? (e.g., chest pain, difficulty breathing)       Denies.  Protocols used: Leg Swelling and  Edema-A-AH

## 2023-08-07 ENCOUNTER — Telehealth: Payer: Self-pay

## 2023-08-07 MED ORDER — DILTIAZEM HCL ER COATED BEADS 120 MG PO CP24: 120.0000 mg | ORAL_CAPSULE | Freq: Every day | ORAL | 0 refills | Status: DC

## 2023-08-07 NOTE — Addendum Note (Signed)
 Addended by: Christy Sartorius on: 08/07/2023 10:57 AM   Modules accepted: Orders

## 2023-08-07 NOTE — Telephone Encounter (Signed)
 Please see encounter from 08/06/2023

## 2023-08-07 NOTE — Telephone Encounter (Signed)
 Copied from CRM [id number]. Topic: Clinical - Medical Advice >> Aug 07, 2023 10:33 AM Viola F wrote: Reason for CRM: Patient called to set up appt for 1/13 - he also called to follow up regarding message sent 08/06/23. He spoke to triage nurse and is waiting for a call back on what to do about his  allergic reaction/swellen

## 2023-08-07 NOTE — Telephone Encounter (Signed)
 Patient informed of message below and voiced understanding. Rx sent.

## 2023-08-13 ENCOUNTER — Ambulatory Visit: Payer: Medicare HMO | Admitting: Family Medicine

## 2023-08-13 DIAGNOSIS — D0359 Melanoma in situ of other part of trunk: Secondary | ICD-10-CM | POA: Diagnosis not present

## 2023-08-13 DIAGNOSIS — D0362 Melanoma in situ of left upper limb, including shoulder: Secondary | ICD-10-CM | POA: Diagnosis not present

## 2023-08-28 ENCOUNTER — Ambulatory Visit: Payer: Medicare HMO | Admitting: Family Medicine

## 2023-08-28 ENCOUNTER — Ambulatory Visit: Payer: Self-pay | Admitting: Family Medicine

## 2023-08-28 NOTE — Telephone Encounter (Signed)
  Chief Complaint: vomiting, diarrhea Symptoms: vomiting, diarrhea, chills Frequency: Since 0200 Pertinent Negatives: Patient denies fever, abdominal pain Disposition: [] ED /[] Urgent Care (no appt availability in office) / [] Appointment(In office/virtual)/ []  [facility]/ [x] Home Care/ [] Refused Recommended Disposition /[] [facility]/ []  Follow-up with PCP Additional Notes: Patient's wife, Lupita Leash, on [place] called stating patient woke up at 0200 vomiting and with diarrhea. She reports that patient has been able to hold down fluids this morning, that she has given him some zofran and he is resting at this time. Per protocol, home care appropriate. Care advice reviewed, caller verbalized understanding. Caller reports she cancelled patient's appt for today for BP reassessment but patient BP has been well managed, states she will call back to reschedule when she knows a time frame for availability. Alerting PCP for review.   Copied from CRM [id number]. Topic: Clinical - Red Word Triage >> Aug 28, 2023  7:33 AM Deaijah H wrote: Red Word that prompted transfer to Nurse Triage: Patients wife called in due to patient Vomitting/Diarrhea Reason for Disposition  MILD or MODERATE vomiting (e.g., 1 - 5 times / day)  Answer Assessment - Initial Assessment Questions 1. VOMITING SEVERITY: "How many times have you vomited in the past 24 hours?"     - MILD:  1 - 2 times/day    - MODERATE: 3 - 5 times/day, decreased oral intake without significant weight loss or symptoms of dehydration    - SEVERE: 6 or more times/day, vomits everything or nearly everything, with significant weight loss, symptoms of dehydration      2 2. ONSET: "When did the vomiting begin?"      0200 this morning 3. FLUIDS: "What fluids or food have you vomited up today?" "Have you been able to keep any fluids down?"     So far, yes, per wife. 4. ABDOMEN PAIN: "Are your having any abdomen pain?" If Yes : "How bad is it and  what does it feel like?" (e.g., crampy, dull, intermittent, constant)      I don't think so 5. DIARRHEA: "Is there any diarrhea?" If Yes, ask: "How many times today?"      Yes, one time but reports it was a significant amount 6. CONTACTS: "Is there anyone else in the family with the same symptoms?"      Denies 7. CAUSE: "What do you think is causing your vomiting?"     Unsure 8. HYDRATION STATUS: "Any signs of dehydration?" (e.g., dry mouth [not only dry lips], too weak to stand) "When did you last urinate?"     Denies 9. OTHER SYMPTOMS: "Do you have any other symptoms?" (e.g., fever, headache, vertigo, vomiting blood or coffee grounds, recent head injury)     Chills  Protocols used: Vomiting-A-AH

## 2023-08-28 NOTE — Telephone Encounter (Signed)
Called patient left a VM to return call so we can get him sch to see Dr. Rollene Fare Video Visit.

## 2023-09-25 ENCOUNTER — Ambulatory Visit (INDEPENDENT_AMBULATORY_CARE_PROVIDER_SITE_OTHER): Payer: Medicare HMO | Admitting: Family Medicine

## 2023-09-25 ENCOUNTER — Encounter: Payer: Self-pay | Admitting: Family Medicine

## 2023-09-25 VITALS — BP 138/80 | HR 61 | Temp 97.4°F | Ht 69.5 in | Wt 195.1 lb

## 2023-09-25 DIAGNOSIS — D0359 Melanoma in situ of other part of trunk: Secondary | ICD-10-CM

## 2023-09-25 DIAGNOSIS — I10 Essential (primary) hypertension: Secondary | ICD-10-CM

## 2023-09-25 DIAGNOSIS — G2581 Restless legs syndrome: Secondary | ICD-10-CM

## 2023-09-25 DIAGNOSIS — N1831 Chronic kidney disease, stage 3a: Secondary | ICD-10-CM

## 2023-09-25 DIAGNOSIS — E785 Hyperlipidemia, unspecified: Secondary | ICD-10-CM | POA: Diagnosis not present

## 2023-09-25 NOTE — Progress Notes (Signed)
 Established Patient Office Visit  Subjective   Patient ID: Ronald Raring, MD, male    DOB: 09-15-1931  Age: 89 y.o. MRN: [id number]  Chief Complaint  Patient presents with   Medical Management of Chronic Issues    Pt reports he is here to follow up on BP.     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  He has history of CAD, hypertension, chronic kidney disease stage III, hyperlipidemia.  Also has history of restless legs and takes Mirapex for that.  Blood pressure currently treated with losartan HCTZ, Cardizem CD 120 mg, and Toprol XL 25 mg.  He was having increased peripheral edema on amlodipine and has seen some improvement with Cardizem.  Still has some edema and usually wears support socks.  No dyspnea.  No orthopnea.  Home blood pressures fairly consistently 130s systolic and 70s diastolic.  Patient relates melanoma excision left upper back shoulder region few months ago Had wide excision with clear margins.  Past Medical History:  Diagnosis Date   Arthritis    hip.knees   Coronary artery disease    Dysrhythmia    History of kidney stones    Hypertension    Kidney stones    Meniere's disease    Past Surgical History:  Procedure Laterality Date   BACK SURGERY     L3-L4   CARDIAC CATHETERIZATION  2012   3 stents   CORONARY ANGIOPLASTY WITH STENT PLACEMENT  1996   3 stents   CYSTOSCOPY/URETEROSCOPY/HOLMIUM LASER/STENT PLACEMENT Right 09/13/2021   Procedure: RIGHT URETEROSCOPY/HOLMIUM LASER/STENT PLACEMENT;  Surgeon: Despina Arias, MD;  Location: [facility];  Service: Urology;  Laterality: Right;   INGUINAL HERNIA REPAIR Left    INGUINAL HERNIA REPAIR Right    LAMINECTOMY     L4-L5    reports that he quit smoking about 58 years ago. His smoking use included cigarettes. He started smoking about 72 years ago. He has a 28 pack-year smoking history. He has never used smokeless tobacco. He reports current alcohol use of about 14.0 standard drinks of alcohol per  week. He reports that he does not use drugs. family history includes Other (age of onset: 75) in his mother; Stroke (age of onset: 61) in his father. No Known Allergies    Review of Systems  Eyes:  Negative for blurred vision.  Respiratory:  Negative for shortness of breath.   Cardiovascular:  Negative for chest pain.  Neurological:  Negative for dizziness, weakness and headaches.      Objective:     BP 138/80 (BP Location: Left Arm, Cuff Size: Normal)   Pulse 61   Temp (!) 97.4 F (36.3 C) (Oral)   Ht 5' 9.5" (1.765 m)   Wt 195 lb 1.6 oz (88.5 kg)   SpO2 96%   BMI 28.40 kg/m  BP Readings from Last 3 Encounters:  09/25/23 138/80  05/07/23 130/80  04/30/23 (!) 149/75   Wt Readings from Last 3 Encounters:  09/25/23 195 lb 1.6 oz (88.5 kg)  07/20/23 188 lb (85.3 kg)  05/07/23 188 lb 3.2 oz (85.4 kg)      Physical Exam Vitals reviewed.  Constitutional:      General: He is not in acute distress.    Appearance: He is well-developed. He is not ill-appearing.  HENT:     Right Ear: External ear normal.     Left Ear: External ear normal.  Eyes:     Pupils: Pupils are equal, round, and  reactive to light.  Neck:     Thyroid: No thyromegaly.  Cardiovascular:     Rate and Rhythm: Normal rate and regular rhythm.  Pulmonary:     Effort: Pulmonary effort is normal. No respiratory distress.     Breath sounds: Normal breath sounds. No wheezing or rales.  Musculoskeletal:     Cervical back: Neck supple.     Comments: Trace pitting edema lower legs bilaterally.  Neurological:     Mental Status: He is alert and oriented to person, place, and time.      No results found for any visits on 09/25/23.  Last CBC Lab Results  Component Value Date   WBC 5.2 04/30/2023   HGB 11.4 (L) 04/30/2023   HCT 34.7 (L) 04/30/2023   MCV 93.3 04/30/2023   MCH 30.6 04/30/2023   RDW 12.8 04/30/2023   PLT 190 04/30/2023   Last metabolic panel Lab Results  Component Value Date    GLUCOSE 100 (H) 04/30/2023   NA 140 04/30/2023   K 3.6 04/30/2023   CL 110 04/30/2023   CO2 23 04/30/2023   BUN 23 04/30/2023   CREATININE 1.63 (H) 04/30/2023   GFRNONAA 40 (L) 04/30/2023   CALCIUM 8.4 (L) 04/30/2023   PROT 5.6 (L) 04/30/2023   ALBUMIN 3.0 (L) 04/30/2023   BILITOT 0.6 04/30/2023   ALKPHOS 68 04/30/2023   AST 15 04/30/2023   ALT 14 04/30/2023   ANIONGAP 7 04/30/2023   Last lipids Lab Results  Component Value Date   CHOL 254 (H) 03/12/2023   HDL 38.30 (L) 03/12/2023   LDLCALC 99 03/10/2022   LDLDIRECT 56.0 03/12/2023   TRIG 324.0 (H) 03/12/2023   CHOLHDL 7 03/12/2023   Last thyroid functions Lab Results  Component Value Date   TSH 2.37 03/12/2023      The ASCVD Risk score (Arnett DK, et al., 2019) failed to calculate for the following reasons:   The 2019 ASCVD risk score is only valid for ages 53 to 28    Assessment & Plan:   #1 hypertension stable.  He has had some improvement in edema since switching from amlodipine to diltiazem.  Also remains on losartan/HCTZ and metoprolol XL.  Home blood pressures fairly stable.  Continue low-sodium diet.  Continue periodic monitoring.  #2 restless legs stable on Mirapex.  #3 chronic kidney disease.  Continue to avoid nonsteroidals.  Recheck blood chemistries at next follow-up in August  Evelena Peat, MD

## 2023-10-15 DIAGNOSIS — Z85820 Personal history of malignant melanoma of skin: Secondary | ICD-10-CM | POA: Diagnosis not present

## 2023-10-15 DIAGNOSIS — Z85828 Personal history of other malignant neoplasm of skin: Secondary | ICD-10-CM | POA: Diagnosis not present

## 2023-10-15 DIAGNOSIS — L570 Actinic keratosis: Secondary | ICD-10-CM | POA: Diagnosis not present

## 2023-10-15 DIAGNOSIS — L309 Dermatitis, unspecified: Secondary | ICD-10-CM | POA: Diagnosis not present

## 2023-11-02 ENCOUNTER — Other Ambulatory Visit: Payer: Self-pay | Admitting: Family Medicine

## 2024-02-01 ENCOUNTER — Other Ambulatory Visit: Payer: Self-pay | Admitting: Family Medicine

## 2024-02-08 ENCOUNTER — Emergency Department (HOSPITAL_COMMUNITY)
Admission: EM | Admit: 2024-02-08 | Discharge: 2024-02-08 | Disposition: A | Discharge status: Home / Self Care | Attending: Emergency Medicine | Admitting: Emergency Medicine

## 2024-02-08 ENCOUNTER — Emergency Department (HOSPITAL_COMMUNITY)

## 2024-02-08 ENCOUNTER — Other Ambulatory Visit: Payer: Self-pay

## 2024-02-08 ENCOUNTER — Encounter (HOSPITAL_COMMUNITY): Payer: Self-pay

## 2024-02-08 DIAGNOSIS — Z7982 Long term (current) use of aspirin: Secondary | ICD-10-CM | POA: Diagnosis not present

## 2024-02-08 DIAGNOSIS — R079 Chest pain, unspecified: Secondary | ICD-10-CM | POA: Diagnosis not present

## 2024-02-08 DIAGNOSIS — N189 Chronic kidney disease, unspecified: Secondary | ICD-10-CM | POA: Insufficient documentation

## 2024-02-08 DIAGNOSIS — Z79899 Other long term (current) drug therapy: Secondary | ICD-10-CM | POA: Insufficient documentation

## 2024-02-08 DIAGNOSIS — I129 Hypertensive chronic kidney disease with stage 1 through stage 4 chronic kidney disease, or unspecified chronic kidney disease: Secondary | ICD-10-CM | POA: Insufficient documentation

## 2024-02-08 DIAGNOSIS — I2510 Atherosclerotic heart disease of native coronary artery without angina pectoris: Secondary | ICD-10-CM | POA: Insufficient documentation

## 2024-02-08 DIAGNOSIS — R600 Localized edema: Secondary | ICD-10-CM | POA: Diagnosis not present

## 2024-02-08 DIAGNOSIS — Z7901 Long term (current) use of anticoagulants: Secondary | ICD-10-CM | POA: Diagnosis not present

## 2024-02-08 LAB — CBC
HCT: 38.2 % — ABNORMAL LOW (ref 39.0–52.0)
Hemoglobin: 12.7 g/dL — ABNORMAL LOW (ref 13.0–17.0)
MCH: 31.2 pg (ref 26.0–34.0)
MCHC: 33.2 g/dL (ref 30.0–36.0)
MCV: 93.9 fL (ref 80.0–100.0)
Platelets: 180 K/uL (ref 150–400)
RBC: 4.07 MIL/uL — ABNORMAL LOW (ref 4.22–5.81)
RDW: 14.1 % (ref 11.5–15.5)
WBC: 7 K/uL (ref 4.0–10.5)
nRBC: 0 % (ref 0.0–0.2)

## 2024-02-08 LAB — BASIC METABOLIC PANEL WITH GFR
Anion gap: 9 (ref 5–15)
BUN: 39 mg/dL — ABNORMAL HIGH (ref 8–23)
CO2: 21 mmol/L — ABNORMAL LOW (ref 22–32)
Calcium: 8.8 mg/dL — ABNORMAL LOW (ref 8.9–10.3)
Chloride: 107 mmol/L (ref 98–111)
Creatinine, Ser: 1.58 mg/dL — ABNORMAL HIGH (ref 0.61–1.24)
GFR, Estimated: 42 mL/min — ABNORMAL LOW (ref >60)
Glucose, Bld: 108 mg/dL — ABNORMAL HIGH (ref 70–99)
Potassium: 4 mmol/L (ref 3.5–5.1)
Sodium: 137 mmol/L (ref 135–145)

## 2024-02-08 LAB — TROPONIN I (HIGH SENSITIVITY)
Troponin I (High Sensitivity): 12 ng/L (ref <18)
Troponin I (High Sensitivity): 10 ng/L (ref <18)

## 2024-02-08 NOTE — Discharge Instructions (Addendum)
 You have been evaluated for your symptoms.  Fortunately your troponin level is within normal limit and your blood work overall did not show any concerning finding.  Your heart rate is slow likely due to your current blood pressure medication.  Please call and follow-up closely with your cardiologist for outpatient evaluation and management.  Do not hesitate to return if your condition return or worsen.  I have also requested our cardiology team to reach out to you for a close follow-up appointment.  You should expect to hear from them in the next several days.

## 2024-02-08 NOTE — ED Provider Notes (Signed)
 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   The history is provided by the patient and medical records. No language interpreter was used.  Chest Pain    89 year old male with significant history of CAD status post cardiac stenting x 10, hypertension, kidney stone, CKD, hyperlipidemia presenting with complaint of chest pain.  Patient report last night after eating dinner he developed what he felt to be heartburn.  He describes a burning sensation to his mid chest, radiates towards his neck.  Pain was waxing waning throughout the night and this morning he did take an antacid and decided to come to the ER.  Shortly after taking antacids, his pain has abated and now he is currently pain-free.  He denies any associate lightheadedness, dizziness, shortness of breath, nausea, or diaphoresis with this episode.  He does not normally have heartburn.  He denies any recent changes to medication.  Denies taking NSAIDs.  No fever or productive cough or shortness of breath.  Prior to Admission medications   Medication Sig Start Date End Date Taking? Authorizing Provider  aspirin 325 MG tablet Take 325 mg by mouth daily.    [provider]  atorvastatin  (LIPITOR) 40 MG tablet Take 1 tablet (40 mg total) by mouth daily. Patient taking differently: Take 40 mg by mouth daily. Taking 1/2 tab. 03/12/23   Burchette, Wolm ORN, MD  clopidogrel  (PLAVIX ) 75 MG tablet Take 1 tablet (75 mg total) by mouth daily. 03/12/23   Burchette, Wolm ORN, MD  diltiazem  (CARDIZEM  CD) 120 MG 24 hr capsule TAKE 1 CAPSULE BY MOUTH DAILY 02/04/24   Burchette, Wolm ORN, MD  fexofenadine (ALLEGRA) 60 MG tablet Take 60 mg by mouth daily as needed for allergies or rhinitis.    [provider]  losartan -hydrochlorothiazide (HYZAAR) 100-12.5 MG tablet Take 1 tablet by  mouth daily. 03/12/23   Burchette, Wolm ORN, MD  metoprolol  succinate (TOPROL -XL) 25 MG 24 hr tablet Take 1 tablet (25 mg total) by mouth daily. 03/12/23   Burchette, Wolm ORN, MD  Naproxen Sodium 220 MG CAPS Take 220 mg by mouth daily as needed (Pain). Patient not taking: Reported on 09/25/2023    [provider]  nitroGLYCERIN (NITROSTAT) 0.4 MG SL tablet Place 0.4 mg under the tongue every 5 (five) minutes as needed for chest pain.    [provider]  pramipexole  (MIRAPEX ) 0.25 MG tablet TAKE 3 TO 4 TABLETS BY MOUTH EVERY NIGHT AT BEDTIME AS NEEDED 03/12/23   Burchette, Wolm ORN, MD  tamsulosin  (FLOMAX ) 0.4 MG CAPS capsule Take 1 capsule (0.4 mg total) by mouth daily. 03/12/23   Burchette, Wolm ORN, MD    Allergies: Patient has no known allergies.    Review of Systems  Cardiovascular:  Positive for chest pain.  All other systems reviewed and are negative.   Updated Vital Signs BP 131/74   Pulse (!) 53   Temp (!) 97.5 F (36.4 C) (Oral)   Resp 13   Ht 5' 10 (1.778 m)   Wt 83.9 kg   SpO2 99%   BMI 26.54 kg/m   Physical Exam Constitutional:      General: He is not in acute distress.    Appearance: He is well-developed.  HENT:     Head: Atraumatic.  Eyes:     Conjunctiva/sclera: Conjunctivae  normal.  Neck:     Comments: Trachea midline no carotid bruit neck nontender Cardiovascular:     Rate and Rhythm: Normal rate and regular rhythm.     Pulses: Normal pulses.     Heart sounds: Normal heart sounds.  Musculoskeletal:     Cervical back: Normal range of motion and neck supple.     Right lower leg: Edema present.     Left lower leg: Edema present.  Skin:    Capillary Refill: Capillary refill takes less than 2 seconds.     Findings: No rash.  Neurological:     Mental Status: He is alert. Mental status is at baseline.  Psychiatric:        Mood and Affect: Mood normal.     (all labs ordered are listed, but only abnormal results are displayed) Labs Reviewed   BASIC METABOLIC PANEL WITH GFR - Abnormal; Notable for the following components:      Result Value   CO2 21 (*)    Glucose, Bld 108 (*)    BUN 39 (*)    Creatinine, Ser 1.58 (*)    Calcium  8.8 (*)    GFR, Estimated 42 (*)    All other components within normal limits  CBC - Abnormal; Notable for the following components:   RBC 4.07 (*)    Hemoglobin 12.7 (*)    HCT 38.2 (*)    All other components within normal limits  TROPONIN I (HIGH SENSITIVITY)  TROPONIN I (HIGH SENSITIVITY)    EKG: None ED ECG REPORT   Date: 02/08/2024  Rate: 60  Rhythm: normal sinus rhythm  QRS Axis: normal  Intervals: PR prolonged  ST/T Wave abnormalities: normal  Conduction Disutrbances:right bundle branch block  Narrative Interpretation:   Old EKG Reviewed: unchanged  I have personally reviewed the EKG tracing and agree with the computerized printout as noted.   Radiology: DG Chest 2 View Result Date: 02/08/2024 CLINICAL DATA:  Chest pain. EXAM: CHEST - 2 VIEW COMPARISON:  April 29, 2023. FINDINGS: The heart size and mediastinal contours are within normal limits. Both lungs are clear. The visualized skeletal structures are unremarkable. IMPRESSION: No active cardiopulmonary disease. Electronically Signed   By: Lynwood Landy Raddle M.D.   On: 02/08/2024 11:41     Procedures   Medications Ordered in the ED - No data to display                                  Medical Decision Making Amount and/or Complexity of Data Reviewed Labs: ordered. Radiology: ordered.   BP (!) 169/76   Pulse 60   Temp (!) 97.5 F (36.4 C)   Resp 20   Ht 5' 10 (1.778 m)   Wt 83.9 kg   SpO2 100%   BMI 26.54 kg/m   45:82 AM  89 year old male with significant history of CAD status post cardiac stenting x 10, hypertension, kidney stone, CKD, hyperlipidemia presenting with complaint of chest pain.  Patient report last night after eating dinner he developed what he felt to be heartburn.  He describes a burning  sensation to his mid chest, radiates towards his neck.  Pain was waxing waning throughout the night and this morning he did take an antacid and decided to come to the ER.  Shortly after taking antacids, his pain has abated and now he is currently pain-free.  He denies any associate lightheadedness, dizziness, shortness of  breath, nausea, or diaphoresis with this episode.  He does not normally have heartburn.  He denies any recent changes to medication.  Denies taking NSAIDs.  No fever or productive cough or shortness of breath.  On exam patient is resting comfortably appears to be in no acute discomfort.  No tenderness to palpation of his neck.  Heart with normal rate and rhythm, lungs are clear abdomen soft nontender he does have trace edema to bilateral lower extremities which is chronic for him.  He is neurovascular intact.  -Labs ordered, independently viewed and interpreted by me.  Labs remarkable for creatinine of 1.58, similar to prior, negative delta troponin which are reassuring -The patient was maintained on a cardiac monitor.  I personally viewed and interpreted the cardiac monitored which showed an underlying rhythm of: Sinus bradycardia -Imaging independently viewed and interpreted by me and I agree with radiologist's interpretation.  Result remarkable for chest x-ray unremarkable -This patient presents to the ED for concern of chest pain, this involves an extensive number of treatment options, and is a complaint that carries with it a high risk of complications and morbidity.  The differential diagnosis includes GERD, gastritis, ACS, PE, pleurisy, pneumonia, muscle skeletal pain -Co morbidities that complicate the patient evaluation includes CAD, hypertension, kidney stone -Treatment includes monitoring -Reevaluation of the patient after these medicines showed that the patient resolved -PCP office notes or outside notes reviewed -Discussion with attending Dr. Elnor -Escalation to  admission/observation considered: patients feels much better, is comfortable with discharge, and will follow up with PCP -Prescription medication considered, patient comfortable with home medication -Social Determinant of Health considered which includes inactivity      Final diagnoses:  Nonspecific chest pain    ED Discharge Orders          Ordered    Ambulatory referral to Cardiology       Comments: If you have not heard from the Cardiology office within the next 72 hours please call [phone].   02/08/24 1331               Nivia Colon, PA-C 02/08/24 1334    Elnor Jayson LABOR, DO 02/15/24 1548

## 2024-02-08 NOTE — ED Triage Notes (Signed)
 Pt states had heartburn and chest pain/ neck pain last night. HX of 10 stents. Denies SHOB.

## 2024-03-01 ENCOUNTER — Other Ambulatory Visit: Payer: Self-pay | Admitting: Family Medicine

## 2024-03-20 ENCOUNTER — Ambulatory Visit: Payer: Self-pay

## 2024-03-20 NOTE — Telephone Encounter (Signed)
 FYI Only or Action Required?: Action required by provider: request for appointment.  Patient was last seen in primary care on 09/25/2023 by Micheal Wolm ORN, MD.  Called Nurse Triage reporting Loss of Consciousness.  Symptoms began yesterday.  Interventions attempted: Rest, hydration, or home remedies.  Symptoms are: stable.  Triage Disposition: See HCP Within 4 Hours (Or PCP Triage)  Patient/caregiver understands and will follow disposition?: YesCopied from CRM [id number]. Topic: Clinical - Red Word Triage >> Mar 20, 2024  3:49 PM Berneda FALCON wrote: Red Word that prompted transfer to Nurse Triage: Traveling yesterday with flying-hadent eaten much, nauseated. When patient got back, he felt like he had blood sugar-passed out, wife Music therapist) thinks it was a seizure. Pt states he has been feeling better. Feels better today but feels weak. No chest pains, no bleeding or anything. Just drank 2 cups of coffee and a lot of water-thinks it was low blood sugar. Reason for Disposition  [1] All other patients AND [2] now alert and feels fine  (Exception: SIMPLE FAINT due to stress, pain, prolonged standing, or suddenly standing)  Answer Assessment - Initial Assessment Questions Pt feels he passed out momentarily while riding in car. Pt feels he was out a minute or two. Daughter was driving. No injury. Pt flew from [place] to [place] and didn't eat. Feels passing out was due to low blood sugar. Pt currently feels fatigued but no other symptoms. Pt denies all other symptoms and wants to be seen In office. Pt stated he has passed out several times before but not lately.        1. ONSET: How long were you unconscious? (e.g., minutes, seconds) When did it happen?     Not sure 2. CONTENT: What happened during the period of unconsciousness? (e.g., seizure activity)      Not sure 3. MENTAL STATUS: Alert and oriented now? (e.g., oriented x 3 = name, month, location)      Alert and oriented x3 4.  TRIGGER: What do you think caused the fainting? What were you doing just before you fainted?  (e.g., exercise, sudden standing up, prolonged standing)     Coffee and water- low blood sugar 5. RECURRENT SYMPTOM: Have you ever passed out before? If Yes, ask: When was the last time? and What happened that time?      yes 6. INJURY: Did you hurt yourself when you fell?      no 7. CARDIAC SYMPTOMS: Have you had any of the following symptoms: chest pain, difficulty breathing, palpitations?     denies 8. NEUROLOGIC SYMPTOMS: Have you had any of the following symptoms: headache, numbness, vertigo, weakness?     Dull headache yesterday  9. GI SYMPTOMS: Have you had any of the following symptoms: abdomen pain, vomiting, diarrhea, blood in stools?     Nausea  10. OTHER SYMPTOMS: Do you have any other symptoms?       fatigue  Protocols used: Fainting-A-AH

## 2024-03-21 ENCOUNTER — Ambulatory Visit (INDEPENDENT_AMBULATORY_CARE_PROVIDER_SITE_OTHER): Admitting: Family Medicine

## 2024-03-21 ENCOUNTER — Ambulatory Visit: Payer: Self-pay | Admitting: Family Medicine

## 2024-03-21 ENCOUNTER — Encounter: Payer: Self-pay | Admitting: Family Medicine

## 2024-03-21 VITALS — BP 130/80 | HR 67 | Resp 16 | Ht 70.0 in | Wt 193.5 lb

## 2024-03-21 DIAGNOSIS — R55 Syncope and collapse: Secondary | ICD-10-CM

## 2024-03-21 DIAGNOSIS — R569 Unspecified convulsions: Secondary | ICD-10-CM

## 2024-03-21 DIAGNOSIS — I10 Essential (primary) hypertension: Secondary | ICD-10-CM

## 2024-03-21 LAB — CBC
HCT: 36.4 % — ABNORMAL LOW (ref 39.0–52.0)
Hemoglobin: 12.2 g/dL — ABNORMAL LOW (ref 13.0–17.0)
MCHC: 33.5 g/dL (ref 30.0–36.0)
MCV: 94.1 fl (ref 78.0–100.0)
Platelets: 163 K/uL (ref 150.0–400.0)
RBC: 3.87 Mil/uL — ABNORMAL LOW (ref 4.22–5.81)
RDW: 15.1 % (ref 11.5–15.5)
WBC: 9.6 K/uL (ref 4.0–10.5)

## 2024-03-21 LAB — COMPREHENSIVE METABOLIC PANEL WITH GFR
ALT: 14 U/L (ref 0–53)
AST: 13 U/L (ref 0–37)
Albumin: 3.8 g/dL (ref 3.5–5.2)
Alkaline Phosphatase: 81 U/L (ref 39–117)
BUN: 29 mg/dL — ABNORMAL HIGH (ref 6–23)
CO2: 24 meq/L (ref 19–32)
Calcium: 8.5 mg/dL (ref 8.4–10.5)
Chloride: 103 meq/L (ref 96–112)
Creatinine, Ser: 1.43 mg/dL (ref 0.40–1.50)
GFR: 43.33 mL/min — ABNORMAL LOW (ref >60.00)
Glucose, Bld: 103 mg/dL — ABNORMAL HIGH (ref 70–99)
Potassium: 3.6 meq/L (ref 3.5–5.1)
Sodium: 136 meq/L (ref 135–145)
Total Bilirubin: 1.3 mg/dL — ABNORMAL HIGH (ref 0.2–1.2)
Total Protein: 6.5 g/dL (ref 6.0–8.3)

## 2024-03-21 NOTE — Assessment & Plan Note (Addendum)
 BP adequately controlled. No changes in current management: Diltiazem  120 mg daily and losartan -HCTZ 100-12.5 mg daily as well as low-salt diet. Continue monitoring BP at home.

## 2024-03-21 NOTE — Progress Notes (Signed)
 ACUTE VISIT Chief Complaint  Patient presents with   Loss of Consciousness    Happened two days ago, has not happened since. Pt thinks it may have been low blood sugar    HPI: Mr.Ronald Case with PMHx of CAD,PVC's, HTN, HLD, CKD III, and BPH who is here today with his wife complaining of a pre-syncopal episode as described above.  He believes this episode was caused by hypoglycemia, but did not confirm with blood sugar reading.  When recalling the episode, he was riding from the airport with his wife and daughter (driver).  He complained of nausea at the time and stated he had to vomit.  When his daughter pulled over, he had the pre-syncopal episode before he was able to exit the vehicle.  H wife reports that he had seizure like activity that lasted about 10 seconds.He had generalized limb movements.  After the episode, his nausea resolved.  He states he did not vomit or lose sphincter control during this episode. No post ictal like symptoms.  He notes he woke up very early that morning and did not eat/drink anything all day while traveling, except for water on the plane.  He felt fatigued the following day and slept for long periods of time once he got home.  Today, he is feeling much better.   Syncope on 04/29/23, when he was evaluated in the ED. Head CT: 1. No evidence of acute intracranial hemorrhage or an acute infarct. 2. Subdural hygroma or chronic subdural hematoma overlying the left frontal and left parietal lobes (measuring up to 8 mm in thickness). 3. Possible small subdural hygroma or chronic subdural hematoma along the right frontal lobe (measuring up to 6 mm in thickness). 4. 2-3 mm rightward midline shift. 5. Mild generalized cerebral atrophy.  He has had similar episodes in the past, about 1 year ago after getting baptized in water. He states he was dehydrated at the time and had not eaten anything. His wife reports he presented to the ED  for this episode and labs showed his BG was low and his serum creatinine was elevated.   He has hx of leg numbness, no new changes.  Denies any headache, visual changes, dypsnea, dysphagia, chest pain, focal weakness, or tingling/numbness.  No dysuria, decreased urine output, or gross hematuria.  Chronic LE edema, unchanged.  Of note, pt reports having loose stools for 2 days prior to his pre-syncopal episode while on a river cruise.   Lab Results  Component Value Date   NA 137 02/08/2024   CL 107 02/08/2024   K 4.0 02/08/2024   CO2 21 (L) 02/08/2024   BUN 39 (H) 02/08/2024   CREATININE 1.58 (H) 02/08/2024   GFRNONAA 42 (L) 02/08/2024   CALCIUM  8.8 (L) 02/08/2024   ALBUMIN 3.0 (L) 04/30/2023   GLUCOSE 108 (H) 02/08/2024   Lab Results  Component Value Date   WBC 7.0 02/08/2024   HGB 12.7 (L) 02/08/2024   HCT 38.2 (L) 02/08/2024   MCV 93.9 02/08/2024   PLT 180 02/08/2024   Review of Systems  Constitutional:  Positive for fatigue. Negative for appetite change, fever and unexpected weight change.  HENT:  Negative for mouth sores and sore throat.   Eyes:  Negative for redness and visual disturbance.  Respiratory:  Negative for cough and wheezing.   Gastrointestinal:  Negative for abdominal pain and vomiting.  Endocrine: Negative for cold intolerance and heat intolerance.  Musculoskeletal:  Negative  for joint swelling and myalgias.  Skin:  Negative for rash.  Neurological:  Negative for syncope and facial asymmetry.  Psychiatric/Behavioral:  Negative for confusion and hallucinations.   See other pertinent positives and negatives in HPI.  Current Outpatient Medications on File Prior to Visit  Medication Sig Dispense Refill   aspirin 325 MG tablet Take 325 mg by mouth daily.     atorvastatin  (LIPITOR) 40 MG tablet Take 1 tablet (40 mg total) by mouth daily. (Patient taking differently: Take 40 mg by mouth daily. Taking 1/2 tab.) 90 tablet 3   clopidogrel  (PLAVIX ) 75 MG tablet  Take 1 tablet (75 mg total) by mouth daily. 90 tablet 3   diltiazem  (CARDIZEM  CD) 120 MG 24 hr capsule TAKE 1 CAPSULE BY MOUTH DAILY 90 capsule 0   fexofenadine (ALLEGRA) 60 MG tablet Take 60 mg by mouth daily as needed for allergies or rhinitis.     losartan -hydrochlorothiazide (HYZAAR) 100-12.5 MG tablet Take 1 tablet by mouth daily. 90 tablet 3   metoprolol  succinate (TOPROL -XL) 25 MG 24 hr tablet TAKE 1 TABLET BY MOUTH DAILY 90 tablet 2   Naproxen Sodium 220 MG CAPS Take 220 mg by mouth daily as needed (Pain).     nitroGLYCERIN (NITROSTAT) 0.4 MG SL tablet Place 0.4 mg under the tongue every 5 (five) minutes as needed for chest pain.     pramipexole  (MIRAPEX ) 0.25 MG tablet TAKE 3 TO 4 TABLETS BY MOUTH EVERY NIGHT AT BEDTIME AS NEEDED 360 tablet 3   tamsulosin  (FLOMAX ) 0.4 MG CAPS capsule Take 1 capsule (0.4 mg total) by mouth daily. 90 capsule 3   No current facility-administered medications on file prior to visit.    Past Medical History:  Diagnosis Date   Arthritis    hip.knees   Cataract 2022   Coronary artery disease    Dysrhythmia    History of kidney stones    Hyperlipidemia 1996   Hypertension    Kidney stones    Meniere's disease    No Known Allergies  Social History   Socioeconomic History   Marital status: Married    Spouse name: Not on file   Number of children: Not on file   Years of education: Not on file   Highest education level: Not on file  Occupational History   Not on file  Tobacco Use   Smoking status: Former    Current packs/day: 0.00    Average packs/day: 2.0 packs/day for 14.0 years (28.0 ttl pk-yrs)    Types: Cigarettes    Start date: 08/01/1951    Quit date: 07/31/1965    Years since quitting: 58.6   Smokeless tobacco: Never  Vaping Use   Vaping status: Never Used  Substance and Sexual Activity   Alcohol use: Yes    Alcohol/week: 14.0 standard drinks of alcohol    Types: 14 Standard drinks or equivalent per week    Comment: 2/day   Drug  use: No   Sexual activity: Not on file  Other Topics Concern   Not on file  Social History Narrative   Not on file   Social Drivers of Health   Financial Resource Strain: Low Risk  (07/20/2023)   Overall Financial Resource Strain (CARDIA)    Difficulty of Paying Living Expenses: Not hard at all  Food Insecurity: No Food Insecurity (07/20/2023)   Hunger Vital Sign    Worried About Running Out of Food in the Last Year: Never true    Ran Out of Food  in the Last Year: Never true  Transportation Needs: No Transportation Needs (07/20/2023)   PRAPARE - Administrator, Civil Service (Medical): No    Lack of Transportation (Non-Medical): No  Physical Activity: Inactive (07/20/2023)   Exercise Vital Sign    Days of Exercise per Week: 0 days    Minutes of Exercise per Session: 0 min  Stress: No Stress Concern Present (07/20/2023)   [employer] of Occupational Health - Occupational Stress Questionnaire    Feeling of Stress : Not at all  Social Connections: Socially Integrated (07/20/2023)   Social Connection and Isolation Panel    Frequency of Communication with Friends and Family: More than three times a week    Frequency of Social Gatherings with Friends and Family: More than three times a week    Attends Religious Services: More than 4 times per year    Active Member of Clubs or Organizations: Yes    Attends Banker Meetings: More than 4 times per year    Marital Status: Married    Vitals:   03/21/24 0951  BP: 130/80  Pulse: 67  Resp: 16  SpO2: 97%   Body mass index is 27.76 kg/m.  Physical Exam Vitals and nursing note reviewed.  Constitutional:      General: He is not in acute distress.    Appearance: He is well-developed.  HENT:     Head: Normocephalic and atraumatic.     Mouth/Throat:     Mouth: Mucous membranes are moist.     Pharynx: Oropharynx is clear.  Eyes:     Conjunctiva/sclera: Conjunctivae normal.  Neck:     Vascular: No  JVD.  Cardiovascular:     Rate and Rhythm: Normal rate and regular rhythm.     Heart sounds: No murmur heard.    Comments: DP pulses palpable. 2+ pitting LE edema, bilateral. Pulmonary:     Effort: Pulmonary effort is normal. No respiratory distress.     Breath sounds: Normal breath sounds.  Abdominal:     Palpations: Abdomen is soft. There is no mass.     Tenderness: There is no abdominal tenderness.  Lymphadenopathy:     Cervical: No cervical adenopathy.  Skin:    General: Skin is warm.     Findings: No erythema or rash.  Neurological:     Mental Status: He is alert and oriented to person, place, and time.     Cranial Nerves: No cranial nerve deficit.     Gait: Gait normal.  Psychiatric:        Mood and Affect: Mood and affect normal.   ASSESSMENT AND PLAN: Mr. Ronald Ellers, Case was seen today for syncopal episodes.  Orders Placed This Encounter  Procedures   CT HEAD WO CONTRAST ( )   Comprehensive metabolic panel with GFR   CBC   Lab Results  Component Value Date   NA 136 03/21/2024   CL 103 03/21/2024   K 3.6 03/21/2024   CO2 24 03/21/2024   BUN 29 (H) 03/21/2024   CREATININE 1.43 03/21/2024   GFR 43.33 (L) 03/21/2024   CALCIUM  8.5 03/21/2024   ALBUMIN 3.8 03/21/2024   GLUCOSE 103 (H) 03/21/2024   Lab Results  Component Value Date   ALT 14 03/21/2024   AST 13 03/21/2024   ALKPHOS 81 03/21/2024   BILITOT 1.3 (H) 03/21/2024   Lab Results  Component Value Date   WBC 9.6 03/21/2024   HGB 12.2 (L) 03/21/2024   HCT  36.4 (L) 03/21/2024   MCV 94.1 03/21/2024   PLT 163.0 03/21/2024   Pre-syncope We discussed possible causes of presyncope. Happened after traveling, he reports little oral intake before episode. ? Vasovagal. Some of his chronic comorbidities could help in contributing factors.  No chest pain, dyspnea, or palpitation, so I did not recommend repeating EKG, which was last done in 01/2024. Because of question of seizure activity + abnormal  head CT in 04/2023, recommend brain MRI, he would like to hold on this but he agrees with repeating head CT. Clearly instructed about warning signs.  -     Comprehensive metabolic panel with GFR; Future -     CBC; Future -     CT HEAD WO CONTRAST ( ); Future  Witnessed seizure-like activity (HCC) Reported by his wife and apparently also witnessed by his daughter. Lasted < 10 sec and no post ictal like symptoms. Differential Dx discussed. Head CT will be arranged asap. Further recommendations according to lab results. He is not driving, recommend not doing so.  -     Comprehensive metabolic panel with GFR; Future -     CBC; Future -     CT HEAD WO CONTRAST ( ); Future  Hypertension, essential Assessment & Plan: BP adequately controlled. No changes in current management: Diltiazem  120 mg daily and losartan -HCTZ 100-12.5 mg daily as well as low-salt diet. Continue monitoring BP at home.  Return in about 6 weeks (around 05/02/2024) for with PCP.  I, Ronald Case, acting as a scribe for Ronald Raider [place], Case., have documented all relevant documentation on the behalf of Ronald Tiegs [place], Case, as directed by   while in the presence of Ronald Shugart [place], Case.  I, Ronald Birkland [place], Case, have reviewed all documentation for this visit. The documentation on 03/21/24 for the exam, diagnosis, procedures, and orders are all accurate and complete.  Ronald Muniz G. [place], Case  [facility]

## 2024-03-21 NOTE — Patient Instructions (Signed)
 A few things to remember from today's visit:  Pre-syncope - Plan: Comprehensive metabolic panel with GFR, CBC, CT HEAD WO CONTRAST ( )  Witnessed seizure-like activity (HCC) - Plan: Comprehensive metabolic panel with GFR, CBC, CT HEAD WO CONTRAST ( ) No changes today. Monitor blood pressure at home. Adequate hydration and rest. Do not drive for now.  If you need refills for medications you take chronically, please call your pharmacy. Do not use My Chart to request refills or for acute issues that need immediate attention. If you send a my chart message, it may take a few days to be addressed, specially if I am not in the office.  Please be sure medication list is accurate. If a new problem present, please set up appointment sooner than planned today.

## 2024-04-02 ENCOUNTER — Ambulatory Visit (HOSPITAL_BASED_OUTPATIENT_CLINIC_OR_DEPARTMENT_OTHER)
Admission: RE | Admit: 2024-04-02 | Discharge: 2024-04-02 | Disposition: A | Discharge status: Home / Self Care | Source: Ambulatory Visit | Attending: Family Medicine | Admitting: Family Medicine

## 2024-04-02 DIAGNOSIS — R569 Unspecified convulsions: Secondary | ICD-10-CM | POA: Diagnosis not present

## 2024-04-02 DIAGNOSIS — R55 Syncope and collapse: Secondary | ICD-10-CM | POA: Diagnosis not present

## 2024-04-02 DIAGNOSIS — R9082 White matter disease, unspecified: Secondary | ICD-10-CM | POA: Diagnosis not present

## 2024-05-03 ENCOUNTER — Other Ambulatory Visit: Payer: Self-pay | Admitting: Family Medicine

## 2024-05-05 ENCOUNTER — Encounter: Payer: Self-pay | Admitting: Family Medicine

## 2024-05-05 ENCOUNTER — Ambulatory Visit (INDEPENDENT_AMBULATORY_CARE_PROVIDER_SITE_OTHER): Admitting: Family Medicine

## 2024-05-05 VITALS — BP 132/64 | HR 74 | Temp 97.5°F | Ht 70.0 in | Wt 192.6 lb

## 2024-05-05 DIAGNOSIS — N1831 Chronic kidney disease, stage 3a: Secondary | ICD-10-CM

## 2024-05-05 DIAGNOSIS — I10 Essential (primary) hypertension: Secondary | ICD-10-CM | POA: Diagnosis not present

## 2024-05-05 DIAGNOSIS — E785 Hyperlipidemia, unspecified: Secondary | ICD-10-CM

## 2024-05-05 MED ORDER — DILTIAZEM HCL ER COATED BEADS 120 MG PO CP24: 120.0000 mg | ORAL_CAPSULE | Freq: Every day | ORAL | 3 refills | Status: AC

## 2024-05-05 NOTE — Progress Notes (Signed)
 Established Patient Office Visit  Subjective   Patient ID: Ronald Glendia Colon, MD, male    DOB: 11/29/1931  Age: 89 y.o. MRN: [id number]  Chief Complaint  Patient presents with   Medical Management of Chronic Issues    F/u from visit with Dr. [place]. Had dizzyness spell. Denied having another episode.     HPI   Dr Case is seen today in follow-up from recent transient syncopal episode.  He and his wife had just gotten back from a river cruise this summer.  It was very hot weather.  He just left the airport in [place] and his daughter who lives in [place] had pick them up and he had estimated 10 seconds of syncope in the car.  Was hydrated very well when he got back to his daughter's house.  Has had no syncope or significant dizziness since then.  Was seen here in August and CT head scheduled which showed no acute abnormalities.  Creatinine stable 1.43.  He does have chronic kidney disease and his baseline creatinine appears to be around 1.45.  Chronic mild anemia with hemoglobin 12 range.  Other electrolytes stable.  He has had no dizziness or chest pain since then.  He has hypertension which is treated with metoprolol , diltiazem , losartan /HCTZ.  Needs refills of his diltiazem .  He remains on atorvastatin  40 mg 1/2 tablet daily for hyperlipidemia.  Stays very active.  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  No difficulties with that.  He had some chronic bilateral leg edema which is unchanged.  This consistently goes down at night.  He plans to get flu vaccine later  Past Medical History:  Diagnosis Date   Arthritis    hip.knees   Cataract 2022   Coronary artery disease    Dysrhythmia    History of kidney stones    Hyperlipidemia 1996   Hypertension    Kidney stones    Meniere's disease    Past Surgical History:  Procedure Laterality Date   BACK SURGERY     L3-L4   CARDIAC CATHETERIZATION  2012   3 stents   CORONARY ANGIOPLASTY WITH STENT PLACEMENT  1996    3 stents   CYSTOSCOPY/URETEROSCOPY/HOLMIUM LASER/STENT PLACEMENT Right 09/13/2021   Procedure: RIGHT URETEROSCOPY/HOLMIUM LASER/STENT PLACEMENT;  Surgeon: Lovie Arlyss CROME, MD;  Location: [facility];  Service: Urology;  Laterality: Right;   INGUINAL HERNIA REPAIR Left    INGUINAL HERNIA REPAIR Right    LAMINECTOMY     L4-L5   SPINE SURGERY  [phone]    reports that he quit smoking about 58 years ago. His smoking use included cigarettes. He started smoking about 72 years ago. He has a 28 pack-year smoking history. He has never used smokeless tobacco. He reports current alcohol use of about 14.0 standard drinks of alcohol per week. He reports that he does not use drugs. family history includes Other (age of onset: 78) in his mother; Stroke (age of onset: 90) in his father. No Known Allergies  Review of Systems  Constitutional:  Negative for chills, fever and malaise/fatigue.  Eyes:  Negative for blurred vision.  Respiratory:  Negative for shortness of breath.   Cardiovascular:  Positive for leg swelling. Negative for chest pain and orthopnea.  Neurological:  Negative for dizziness, weakness and headaches.      Objective:     BP 132/64 (BP Location: Left Arm, Patient Position: Sitting, Cuff Size: Normal)   Pulse 74   Ht 5' 10 (  1.778 m)   Wt 192 lb 9.6 oz (87.4 kg)   SpO2 97%   BMI 27.64 kg/m  BP Readings from Last 3 Encounters:  05/05/24 132/64  03/21/24 130/80  02/08/24 131/74   Wt Readings from Last 3 Encounters:  05/05/24 192 lb 9.6 oz (87.4 kg)  03/21/24 193 lb 8 oz (87.8 kg)  02/08/24 185 lb (83.9 kg)      Physical Exam Vitals reviewed.  Constitutional:      General: He is not in acute distress.    Appearance: He is well-developed. He is not ill-appearing.  Eyes:     Pupils: Pupils are equal, round, and reactive to light.  Neck:     Thyroid : No thyromegaly.  Cardiovascular:     Rate and Rhythm: Normal rate and regular rhythm.  Pulmonary:     Effort:  Pulmonary effort is normal. No respiratory distress.     Breath sounds: Normal breath sounds. No wheezing or rales.  Musculoskeletal:     Cervical back: Neck supple.     Comments: He has knee-high support garments on bilaterally  Neurological:     Mental Status: He is alert and oriented to person, place, and time.      No results found for any visits on 05/05/24.  Last CBC Lab Results  Component Value Date   WBC 9.6 03/21/2024   HGB 12.2 (L) 03/21/2024   HCT 36.4 (L) 03/21/2024   MCV 94.1 03/21/2024   MCH 31.2 02/08/2024   RDW 15.1 03/21/2024   PLT 163.0 03/21/2024   Last metabolic panel Lab Results  Component Value Date   GLUCOSE 103 (H) 03/21/2024   NA 136 03/21/2024   K 3.6 03/21/2024   CL 103 03/21/2024   CO2 24 03/21/2024   BUN 29 (H) 03/21/2024   CREATININE 1.43 03/21/2024   GFR 43.33 (L) 03/21/2024   CALCIUM  8.5 03/21/2024   PROT 6.5 03/21/2024   ALBUMIN 3.8 03/21/2024   BILITOT 1.3 (H) 03/21/2024   ALKPHOS 81 03/21/2024   AST 13 03/21/2024   ALT 14 03/21/2024   ANIONGAP 9 02/08/2024      The ASCVD Risk score (Arnett DK, et al., 2019) failed to calculate for the following reasons:   The 2019 ASCVD risk score is only valid for ages 57 to 16    Assessment & Plan:   #1 hypertension stable.  Refill diltiazem  for 1 year.  Continue low-sodium diet  #2 chronic kidney disease stage III.  Continue to avoid nonsteroidals.  Recheck in 78-month follow-up  #3 hyperlipidemia treated with atorvastatin .  No myalgias.  Plan fasting lipids and hepatic panel at 50-month follow-up  #4 transient syncopal episode couple months ago.  Probably related to dehydration.  He had no episodes since then.  No strong clinical suspicion of seizure activity. Wolm Scarlet, MD

## 2024-05-05 NOTE — Patient Instructions (Signed)
 Remember flu vaccine and consider RSV vaccine this Fall.

## 2024-05-28 ENCOUNTER — Other Ambulatory Visit: Payer: Self-pay | Admitting: Family Medicine

## 2024-06-03 ENCOUNTER — Other Ambulatory Visit: Payer: Self-pay | Admitting: Family Medicine

## 2024-06-17 ENCOUNTER — Other Ambulatory Visit: Payer: Self-pay | Admitting: Family Medicine

## 2024-07-09 ENCOUNTER — Ambulatory Visit (HOSPITAL_COMMUNITY)
Admission: RE | Admit: 2024-07-09 | Discharge: 2024-07-09 | Disposition: A | Discharge status: Home / Self Care | Source: Ambulatory Visit | Attending: Surgery | Admitting: Surgery

## 2024-07-09 ENCOUNTER — Other Ambulatory Visit (HOSPITAL_COMMUNITY): Payer: Self-pay | Admitting: Medical

## 2024-07-09 DIAGNOSIS — M79662 Pain in left lower leg: Secondary | ICD-10-CM | POA: Insufficient documentation

## 2024-07-09 DIAGNOSIS — L03116 Cellulitis of left lower limb: Secondary | ICD-10-CM | POA: Diagnosis not present

## 2024-07-15 ENCOUNTER — Ambulatory Visit: Admitting: Family Medicine

## 2024-07-15 ENCOUNTER — Encounter: Payer: Self-pay | Admitting: Family Medicine

## 2024-07-15 VITALS — BP 150/66 | HR 71 | Temp 98.2°F | Wt 201.0 lb

## 2024-07-15 DIAGNOSIS — R600 Localized edema: Secondary | ICD-10-CM

## 2024-07-15 DIAGNOSIS — L03116 Cellulitis of left lower limb: Secondary | ICD-10-CM

## 2024-07-15 MED ORDER — DOXYCYCLINE HYCLATE 100 MG PO CAPS: 100.0000 mg | ORAL_CAPSULE | Freq: Two times a day (BID) | ORAL | 0 refills | Status: AC

## 2024-07-15 MED ORDER — FUROSEMIDE 20 MG PO TABS: 20.0000 mg | ORAL_TABLET | Freq: Every day | ORAL | 3 refills | Status: AC | PRN

## 2024-07-15 NOTE — Progress Notes (Signed)
 Established Patient Office Visit  Subjective   Patient ID: Ronald Glendia Colon, MD, male    DOB: January 12, 1932  Age: 89 y.o. MRN: [id number]  Chief Complaint  Patient presents with   Cellulitis    HPI   Dr Case is seen for follow-up regarding recent cellulitis left leg and some gradually progressive bilateral lower extremity edema.  He states he went to orthopedic walk-in clinic on the 10th.  He had initial concern whether he could have DVT.  Venous Doppler revealed no clots.  He had some redness and warmth and was started on doxycycline  I does feel like there is been at least moderate improvement.  Less erythema and some less warmth.  Not quite as tender.  Denies any fevers or chills.  Does have some bilateral leg edema and weight gain of about 8 or 9 pounds since last visit.  He attributes some of this to overeating during the holidays.  Denies any orthopnea or increased dyspnea.  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  Past Medical History:  Diagnosis Date   Arthritis    hip.knees   Cataract 2022   Coronary artery disease    Dysrhythmia    History of kidney stones    Hyperlipidemia 1996   Hypertension    Kidney stones    Meniere's disease    Past Surgical History:  Procedure Laterality Date   BACK SURGERY     L3-L4   CARDIAC CATHETERIZATION  2012   3 stents   CORONARY ANGIOPLASTY WITH STENT PLACEMENT  1996   3 stents   CYSTOSCOPY/URETEROSCOPY/HOLMIUM LASER/STENT PLACEMENT Right 09/13/2021   Procedure: RIGHT URETEROSCOPY/HOLMIUM LASER/STENT PLACEMENT;  Surgeon: Lovie Arlyss CROME, MD;  Location: [facility];  Service: Urology;  Laterality: Right;   INGUINAL HERNIA REPAIR Left    INGUINAL HERNIA REPAIR Right    LAMINECTOMY     L4-L5   SPINE SURGERY  [phone]    reports that he quit smoking about 58 years ago. His smoking use included cigarettes. He started smoking about 73 years ago. He has a 28 pack-year smoking history. He has never used smokeless tobacco.  He reports current alcohol use of about 14.0 standard drinks of alcohol per week. He reports that he does not use drugs. family history includes Other (age of onset: 61) in his mother; Stroke (age of onset: 46) in his father. Allergies[1]  Review of Systems  Constitutional:  Negative for chills and fever.  Respiratory:  Negative for shortness of breath.   Cardiovascular:  Positive for leg swelling. Negative for chest pain, palpitations and orthopnea.      Objective:     BP (!) 150/66   Pulse 71   Temp 98.2 F (36.8 C) (Oral)   Wt 201 lb (91.2 kg)   SpO2 98%   BMI 28.84 kg/m  BP Readings from Last 3 Encounters:  07/15/24 (!) 150/66  05/05/24 132/64  03/21/24 130/80   Wt Readings from Last 3 Encounters:  07/15/24 201 lb (91.2 kg)  05/05/24 192 lb 9.6 oz (87.4 kg)  03/21/24 193 lb 8 oz (87.8 kg)      Physical Exam Vitals reviewed.  Constitutional:      General: He is not in acute distress.    Appearance: He is not ill-appearing.  Cardiovascular:     Rate and Rhythm: Normal rate and regular rhythm.  Pulmonary:     Effort: Pulmonary effort is normal.     Breath sounds: Normal breath sounds. No wheezing or  rales.  Musculoskeletal:     Right lower leg: Edema present.     Left lower leg: Edema present.     Comments: He has bilateral leg edema with minimal pitting up to at least the knees bilaterally  Skin:    Comments: Leg reveals some erythema involving the lower third of the leg especially.  He has a small eschar left anterior leg.  Mild warmth.  No open ulcerations.  Minimally tender to palpation  Neurological:     Mental Status: He is alert.      No results found for any visits on 07/15/24.  Last CBC Lab Results  Component Value Date   WBC 9.6 03/21/2024   HGB 12.2 (L) 03/21/2024   HCT 36.4 (L) 03/21/2024   MCV 94.1 03/21/2024   MCH 31.2 02/08/2024   RDW 15.1 03/21/2024   PLT 163.0 03/21/2024   Last metabolic panel Lab Results  Component Value Date    GLUCOSE 103 (H) 03/21/2024   NA 136 03/21/2024   K 3.6 03/21/2024   CL 103 03/21/2024   CO2 24 03/21/2024   BUN 29 (H) 03/21/2024   CREATININE 1.43 03/21/2024   GFR 43.33 (L) 03/21/2024   CALCIUM  8.5 03/21/2024   PROT 6.5 03/21/2024   ALBUMIN 3.8 03/21/2024   BILITOT 1.3 (H) 03/21/2024   ALKPHOS 81 03/21/2024   AST 13 03/21/2024   ALT 14 03/21/2024   ANIONGAP 9 02/08/2024   Last lipids Lab Results  Component Value Date   CHOL 254 (H) 03/12/2023   HDL 38.30 (L) 03/12/2023   LDLCALC 99 03/10/2022   LDLDIRECT 56.0 03/12/2023   TRIG 324.0 (H) 03/12/2023   CHOLHDL 7 03/12/2023   Last hemoglobin A1c No results found for: HGBA1C    The ASCVD Risk score (Arnett DK, et al., 2019) failed to calculate for the following reasons:   The 2019 ASCVD risk score is only valid for ages 55 to 42   * - Cholesterol units were assumed    Assessment & Plan:   #1 improving cellulitis left leg on doxycycline .  He was given another 7 days worth.   Continue elevation.  Follow-up for any fever or any recurrent progressive redness or other concerns  #2 bilateral leg edema at baseline.  Recent labs revealed stable kidney function.  He has stage III chronic kidney disease.  Probably has some venous stasis in combination with diastolic dysfunction.  He had some edema for quite some time.  Reiterated importance of low-sodium diet.  We wrote for limited furosemide  20 mg to take 1 daily as needed for increased edema.  Follow-up closely for any shortness of breath or progressive edema.  Consider echo for any progressive symptoms  Wolm Scarlet, MD     [1] No Known Allergies
